# Patient Record
Sex: Male | Born: 1960 | Race: White | Hispanic: No | Smoking: Current every day smoker
Health system: Southern US, Community
[De-identification: ages and names within clinical notes are randomized; demographics above are authoritative.]

## PROBLEM LIST (undated history)

## (undated) DIAGNOSIS — K5792 Diverticulitis of intestine, part unspecified, without perforation or abscess without bleeding: Secondary | ICD-10-CM

## (undated) DIAGNOSIS — F1910 Other psychoactive substance abuse, uncomplicated: Secondary | ICD-10-CM

## (undated) HISTORY — PX: HERNIA REPAIR: SHX51

## (undated) HISTORY — PX: DENTAL SURGERY: SHX609

---

## 2004-04-03 ENCOUNTER — Emergency Department (HOSPITAL_COMMUNITY): Admission: EM | Admit: 2004-04-03 | Discharge: 2004-04-03 | Payer: Self-pay | Admitting: Emergency Medicine

## 2006-01-06 ENCOUNTER — Encounter: Payer: Self-pay | Admitting: Emergency Medicine

## 2006-01-07 ENCOUNTER — Inpatient Hospital Stay (HOSPITAL_COMMUNITY): Admission: EM | Admit: 2006-01-07 | Discharge: 2006-01-09 | Payer: Self-pay | Admitting: Emergency Medicine

## 2006-01-25 ENCOUNTER — Ambulatory Visit (HOSPITAL_COMMUNITY): Admission: RE | Admit: 2006-01-25 | Discharge: 2006-01-25 | Payer: Self-pay | Admitting: Family Medicine

## 2006-02-28 ENCOUNTER — Emergency Department (HOSPITAL_COMMUNITY): Admission: EM | Admit: 2006-02-28 | Discharge: 2006-02-28 | Payer: Self-pay | Admitting: Emergency Medicine

## 2006-11-08 ENCOUNTER — Ambulatory Visit (HOSPITAL_COMMUNITY): Admission: RE | Admit: 2006-11-08 | Discharge: 2006-11-08 | Payer: Self-pay | Admitting: Family Medicine

## 2007-04-25 ENCOUNTER — Ambulatory Visit (HOSPITAL_COMMUNITY): Admission: RE | Admit: 2007-04-25 | Discharge: 2007-04-25 | Payer: Self-pay | Admitting: Family Medicine

## 2007-05-12 ENCOUNTER — Emergency Department (HOSPITAL_COMMUNITY): Admission: EM | Admit: 2007-05-12 | Discharge: 2007-05-13 | Payer: Self-pay | Admitting: Emergency Medicine

## 2007-09-06 ENCOUNTER — Ambulatory Visit (HOSPITAL_COMMUNITY): Admission: RE | Admit: 2007-09-06 | Discharge: 2007-09-06 | Payer: Self-pay | Admitting: Family Medicine

## 2007-09-28 ENCOUNTER — Emergency Department (HOSPITAL_COMMUNITY): Admission: EM | Admit: 2007-09-28 | Discharge: 2007-09-28 | Payer: Self-pay | Admitting: Emergency Medicine
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
## 2009-10-09 ENCOUNTER — Ambulatory Visit (HOSPITAL_COMMUNITY): Admission: RE | Admit: 2009-10-09 | Discharge: 2009-10-09 | Payer: Self-pay | Admitting: General Surgery

## 2010-07-20 ENCOUNTER — Emergency Department (HOSPITAL_COMMUNITY)
Admission: EM | Admit: 2010-07-20 | Discharge: 2010-07-20 | Payer: Self-pay | Source: Home / Self Care | Admitting: Emergency Medicine

## 2010-07-26 LAB — CBC
HCT: 39.6 % (ref 39.0–52.0)
Hemoglobin: 14.2 g/dL (ref 13.0–17.0)
MCH: 31.6 pg (ref 26.0–34.0)
MCHC: 35.9 g/dL (ref 30.0–36.0)
MCV: 88.2 fL (ref 78.0–100.0)
Platelets: 201 10*3/uL (ref 150–400)
RBC: 4.49 MIL/uL (ref 4.22–5.81)
RDW: 12.8 % (ref 11.5–15.5)
WBC: 5.6 10*3/uL (ref 4.0–10.5)

## 2010-07-26 LAB — BASIC METABOLIC PANEL
BUN: 3 mg/dL — ABNORMAL LOW (ref 6–23)
CO2: 30 mEq/L (ref 19–32)
Calcium: 9.7 mg/dL (ref 8.4–10.5)
Chloride: 101 mEq/L (ref 96–112)
Creatinine, Ser: 1 mg/dL (ref 0.4–1.5)
GFR calc Af Amer: >60 mL/min (ref >60)
GFR calc non Af Amer: >60 mL/min (ref >60)
Glucose, Bld: 95 mg/dL (ref 70–99)
Potassium: 4 mEq/L (ref 3.5–5.1)
Sodium: 139 mEq/L (ref 135–145)

## 2010-07-26 LAB — RAPID URINE DRUG SCREEN, HOSP PERFORMED
Amphetamines: NOT DETECTED
Barbiturates: NOT DETECTED
Benzodiazepines: POSITIVE — AB
Cocaine: NOT DETECTED
Opiates: NOT DETECTED
Tetrahydrocannabinol: NOT DETECTED

## 2010-07-26 LAB — DIFFERENTIAL
Basophils Absolute: 0 10*3/uL (ref 0.0–0.1)
Basophils Relative: 0 % (ref 0–1)
Eosinophils Absolute: 0.1 10*3/uL (ref 0.0–0.7)
Eosinophils Relative: 1 % (ref 0–5)
Lymphocytes Relative: 32 % (ref 12–46)
Lymphs Abs: 1.8 10*3/uL (ref 0.7–4.0)
Monocytes Absolute: 1 10*3/uL (ref 0.1–1.0)
Monocytes Relative: 18 % — ABNORMAL HIGH (ref 3–12)
Neutro Abs: 2.7 10*3/uL (ref 1.7–7.7)
Neutrophils Relative %: 48 % (ref 43–77)

## 2010-07-26 LAB — ETHANOL: Alcohol, Ethyl (B): <5 mg/dL (ref 0–10)

## 2010-08-02 ENCOUNTER — Encounter: Payer: Self-pay | Admitting: Internal Medicine

## 2010-10-03 LAB — DIFFERENTIAL
Basophils Absolute: 0 10*3/uL (ref 0.0–0.1)
Basophils Relative: 0 % (ref 0–1)
Eosinophils Absolute: 0.2 10*3/uL (ref 0.0–0.7)
Eosinophils Relative: 1 % (ref 0–5)
Lymphocytes Relative: 16 % (ref 12–46)
Lymphs Abs: 2.2 10*3/uL (ref 0.7–4.0)
Monocytes Absolute: 0.9 10*3/uL (ref 0.1–1.0)
Monocytes Relative: 6 % (ref 3–12)
Neutro Abs: 10.7 10*3/uL — ABNORMAL HIGH (ref 1.7–7.7)
Neutrophils Relative %: 77 % (ref 43–77)

## 2010-10-03 LAB — CBC
HCT: 43 % (ref 39.0–52.0)
Hemoglobin: 14.9 g/dL (ref 13.0–17.0)
MCHC: 34.6 g/dL (ref 30.0–36.0)
MCV: 92.6 fL (ref 78.0–100.0)
Platelets: 171 10*3/uL (ref 150–400)
RBC: 4.65 MIL/uL (ref 4.22–5.81)
RDW: 14.3 % (ref 11.5–15.5)
WBC: 14 10*3/uL — ABNORMAL HIGH (ref 4.0–10.5)

## 2010-10-03 LAB — BASIC METABOLIC PANEL
BUN: 8 mg/dL (ref 6–23)
CO2: 30 mEq/L (ref 19–32)
Calcium: 9.2 mg/dL (ref 8.4–10.5)
Chloride: 103 mEq/L (ref 96–112)
Creatinine, Ser: 1.03 mg/dL (ref 0.4–1.5)
GFR calc Af Amer: >60 mL/min (ref >60)
GFR calc non Af Amer: >60 mL/min (ref >60)
Glucose, Bld: 69 mg/dL — ABNORMAL LOW (ref 70–99)
Potassium: 3.8 mEq/L (ref 3.5–5.1)
Sodium: 140 mEq/L (ref 135–145)
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
## 2011-01-22 ENCOUNTER — Emergency Department (HOSPITAL_COMMUNITY)
Admission: EM | Admit: 2011-01-22 | Discharge: 2011-01-22 | Disposition: A | Discharge status: Home / Self Care | Payer: Self-pay | Attending: Emergency Medicine | Admitting: Emergency Medicine

## 2011-01-22 ENCOUNTER — Encounter: Payer: Self-pay | Admitting: Emergency Medicine

## 2011-01-22 ENCOUNTER — Other Ambulatory Visit: Payer: Self-pay

## 2011-01-22 DIAGNOSIS — R45851 Suicidal ideations: Secondary | ICD-10-CM | POA: Insufficient documentation

## 2011-01-22 DIAGNOSIS — F1410 Cocaine abuse, uncomplicated: Secondary | ICD-10-CM

## 2011-01-22 DIAGNOSIS — F1010 Alcohol abuse, uncomplicated: Secondary | ICD-10-CM | POA: Insufficient documentation

## 2011-01-22 DIAGNOSIS — F17200 Nicotine dependence, unspecified, uncomplicated: Secondary | ICD-10-CM | POA: Insufficient documentation

## 2011-01-22 DIAGNOSIS — F1420 Cocaine dependence, uncomplicated: Secondary | ICD-10-CM | POA: Insufficient documentation

## 2011-01-22 LAB — DIFFERENTIAL
Basophils Relative: 1 % (ref 0–1)
Eosinophils Absolute: 0.1 10*3/uL (ref 0.0–0.7)
Neutrophils Relative %: 51 % (ref 43–77)

## 2011-01-22 LAB — CBC
MCH: 30.8 pg (ref 26.0–34.0)
MCHC: 34.7 g/dL (ref 30.0–36.0)
Platelets: 189 10*3/uL (ref 150–400)
RBC: 4.94 MIL/uL (ref 4.22–5.81)

## 2011-01-22 LAB — BASIC METABOLIC PANEL
GFR calc Af Amer: >60 mL/min (ref >60)
GFR calc non Af Amer: >60 mL/min (ref >60)
Potassium: 3.2 mEq/L — ABNORMAL LOW (ref 3.5–5.1)
Sodium: 140 mEq/L (ref 135–145)

## 2011-01-22 LAB — RAPID URINE DRUG SCREEN, HOSP PERFORMED
Amphetamines: NOT DETECTED
Barbiturates: NOT DETECTED
Cocaine: POSITIVE — AB
Opiates: NOT DETECTED
Tetrahydrocannabinol: NOT DETECTED

## 2011-01-22 LAB — ETHANOL
Alcohol, Ethyl (B): 206 mg/dL — ABNORMAL HIGH (ref 0–11)
Alcohol, Ethyl (B): 74 mg/dL — ABNORMAL HIGH (ref 0–11)

## 2011-01-22 LAB — URINALYSIS, ROUTINE W REFLEX MICROSCOPIC
Bilirubin Urine: NEGATIVE
Glucose, UA: NEGATIVE mg/dL
Hgb urine dipstick: NEGATIVE
Protein, ur: NEGATIVE mg/dL

## 2011-01-22 MED ORDER — FOLIC ACID 1 MG PO TABS
1.0000 mg | ORAL_TABLET | Freq: Once | ORAL | Status: AC
  Administered 2011-01-22: 1 mg via ORAL
  Filled 2011-01-22: qty 1

## 2011-01-22 MED ORDER — POTASSIUM CHLORIDE CRYS ER 20 MEQ PO TBCR
60.0000 meq | EXTENDED_RELEASE_TABLET | Freq: Once | ORAL | Status: AC
  Administered 2011-01-22: 60 meq via ORAL
  Filled 2011-01-22: qty 3

## 2011-01-22 MED ORDER — NICOTINE 21 MG/24HR TD PT24
21.0000 mg | MEDICATED_PATCH | Freq: Once | TRANSDERMAL | Status: DC
  Administered 2011-01-22: 21 mg via TRANSDERMAL
  Filled 2011-01-22: qty 1

## 2011-01-22 MED ORDER — VITAMIN B-1 100 MG PO TABS
100.0000 mg | ORAL_TABLET | Freq: Once | ORAL | Status: AC
  Administered 2011-01-22: 100 mg via ORAL
  Filled 2011-01-22: qty 1

## 2011-01-22 NOTE — ED Notes (Signed)
Patient with no complaints at this time. Respirations even and unlabored. Skin warm/dry. Discharge instructions reviewed with patient at this time. Patient given opportunity to voice concerns/ask questions. Patient discharged at this time and left Emergency Department with steady gait.   

## 2011-01-22 NOTE — ED Provider Notes (Addendum)
History     Chief Complaint  Patient presents with  . Addiction Problem  . 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 Has spent >$7000 on cocaine and other drugs. He also drinks in an attempt to "take the edge off". He worked as a Psychologist, occupational for years and is currently laid off. He is using the money he has made in the past.He has used heroin, abuses narcotics and benzodiazepines. He has a sister that committed suicide due to alcohol. Since he has escalated his use of heroin, he has thought on occasion that if he could not get help he might have to end his life as she did. He denies current suicidal ideation. He is not psychotic.  Patient is a 50 y.o. male presenting with drug/alcohol assessment. The history is provided by the patient.  Drug / Alcohol Assessment Primary symptoms include agitation and intoxication. This is a chronic problem. The current episode started more than 1 week ago. The problem has not changed since onset.Suspected agents include alcohol, cocaine, heroin, opiates and prescription drugs.    History reviewed. No pertinent past medical history.  Past Surgical History  Procedure Date  . Hernia repair     History reviewed. No pertinent family history.  History  Substance Use Topics  . Smoking status: Current Everyday Smoker -- 4.0 packs/day    Types: Cigarettes  . Smokeless tobacco: Not on file  . Alcohol Use: Yes      Review of Systems  Psychiatric/Behavioral: Positive for agitation.  All other systems reviewed and are negative.    Physical Exam  BP 148/92  Pulse 85  Temp(Src) 97.8 F (36.6 C) (Oral)  Resp 18  Ht 6' (1.829 m)  Wt 179 lb (81.194 kg)  BMI 24.28 kg/m2  SpO2 98%  Physical Exam  Constitutional: He is oriented to person, place, and time. He appears well-developed and well-nourished.       dishevled  HENT:  Head: Normocephalic.  Right Ear: External ear normal.    Left Ear: External ear normal.  Nose: Nose normal.  Mouth/Throat: Oropharynx is clear and moist.  Eyes: Conjunctivae and EOM are normal. Pupils are equal, round, and reactive to light.  Neck: Normal range of motion. Neck supple. No JVD present. No thyromegaly present.  Cardiovascular: Normal rate, regular rhythm, normal heart sounds and intact distal pulses.   Pulmonary/Chest: Effort normal and breath sounds normal.  Abdominal: Soft. Bowel sounds are normal.  Musculoskeletal: Normal range of motion.  Neurological: He is alert and oriented to person, place, and time. He has normal reflexes.  Skin: Skin is warm and dry.  Psychiatric: He has a normal mood and affect. His behavior is normal. Thought content normal.    ED Course  Procedures  MDM  Date: 01/22/2011  Rate: 80 Rhythm: normal sinus rhythm  QRS Axis: normal  Intervals: minimal criteria for LVH  ST/T Wave abnormalities: normal  Conduction Disutrbances:none  Narrative Interpretation:   Old EKG Reviewed: none available        Dquan Cortopassi S. Colon Branch, MD 01/22/11 [id number]  Nicoletta Dress. Colon Branch, MD 01/22/11 [id number]  Nicoletta Dress. Colon Branch, MD 01/22/11 [phone]

## 2011-01-22 NOTE — ED Provider Notes (Signed)
History     Chief Complaint  Patient presents with  . Addiction Problem  . Suicidal   HPI  History reviewed. No pertinent past medical history.  Past Surgical History  Procedure Date  . Hernia repair     History reviewed. No pertinent family history.  History  Substance Use Topics  . Smoking status: Current Everyday Smoker -- 4.0 packs/day    Types: Cigarettes  . Smokeless tobacco: Not on file  . Alcohol Use: Yes      Review of Systems  Physical Exam  BP 131/85  Pulse 81  Temp(Src) 97.8 F (36.6 C) (Oral)  Resp 20  Ht 6' (1.829 m)  Wt 179 lb (81.194 kg)  BMI 24.28 kg/m2  SpO2 100%  Physical Exam  ED Course  Procedures  MDM  Samson Frederic, ACT states patient can be discharged to go to [facility] as an outpatient. He doesn't meet criteria for alcohol detox and the cocaine detox can be done as an outpatient. PT sitting on stretcher and is agreeable to being discharged.     Ward Givens, MD 01/22/11 [phone]

## 2011-01-22 NOTE — ED Notes (Signed)
Patient reports history of extensive drug use over past two months. Patient reports feeling hopeless and depressed and actively wants help to detox from drugs and alcohol. Patient denies SI/HI at this time. Denies hallucinations and no delusions noted. Will continue to monitor patient closely.  Patient provided breakfast tray.

## 2011-01-22 NOTE — ED Notes (Signed)
Ella with ACT team in with patient at this time.

## 2011-01-22 NOTE — ED Notes (Signed)
Patient states has been using heroin x 4 years and has done cocaine for "many" years; last used heroin 3-4 days ago and cocaine 4 hours ago.  Patient states that he has been drinking beer tonight.  Patient states he has also used narcotic pain pills and Xanax abuse.  Patient states tonight he felt his heart racing and had thoughts of suicide.

## 2011-01-22 NOTE — Consult Note (Signed)
Spoke with Hattie Perch, Behavioral Health ACT who will see the patient later in the day. His ETOH level is too high to be considered by facilities at the present. Advised her that is medically cleared for admission to a psych facility/treatment.

## 2011-01-22 NOTE — ED Notes (Signed)
Pt came in wanting help to get off alcohol, cocaine and heroin; pt states he last used cocaine x4 hrs ago; pt states he has been using these drugs for many years and spent $7,000 in the last month on drugs; pt states he hears voices in his head that tell him he has to kill himself in order to get off the drugs; pt's sister and mother are with him at this time

## 2011-04-05 ENCOUNTER — Emergency Department (HOSPITAL_COMMUNITY): Payer: Self-pay

## 2011-04-05 ENCOUNTER — Emergency Department (HOSPITAL_COMMUNITY)
Admission: EM | Admit: 2011-04-05 | Discharge: 2011-04-05 | Disposition: A | Discharge status: Home / Self Care | Payer: Self-pay | Attending: Emergency Medicine | Admitting: Emergency Medicine

## 2011-04-05 ENCOUNTER — Encounter (HOSPITAL_COMMUNITY): Payer: Self-pay

## 2011-04-05 ENCOUNTER — Other Ambulatory Visit: Payer: Self-pay

## 2011-04-05 DIAGNOSIS — T6391XA Toxic effect of contact with unspecified venomous animal, accidental (unintentional), initial encounter: Secondary | ICD-10-CM | POA: Insufficient documentation

## 2011-04-05 DIAGNOSIS — T63461A Toxic effect of venom of wasps, accidental (unintentional), initial encounter: Secondary | ICD-10-CM | POA: Insufficient documentation

## 2011-04-05 DIAGNOSIS — F1910 Other psychoactive substance abuse, uncomplicated: Secondary | ICD-10-CM | POA: Insufficient documentation

## 2011-04-05 DIAGNOSIS — R0602 Shortness of breath: Secondary | ICD-10-CM | POA: Insufficient documentation

## 2011-04-05 DIAGNOSIS — R1310 Dysphagia, unspecified: Secondary | ICD-10-CM | POA: Insufficient documentation

## 2011-04-05 DIAGNOSIS — R109 Unspecified abdominal pain: Secondary | ICD-10-CM

## 2011-04-05 DIAGNOSIS — R079 Chest pain, unspecified: Secondary | ICD-10-CM | POA: Insufficient documentation

## 2011-04-05 DIAGNOSIS — F17200 Nicotine dependence, unspecified, uncomplicated: Secondary | ICD-10-CM | POA: Insufficient documentation

## 2011-04-05 DIAGNOSIS — R0789 Other chest pain: Secondary | ICD-10-CM | POA: Insufficient documentation

## 2011-04-05 LAB — DIFFERENTIAL
Basophils Absolute: 0 10*3/uL (ref 0.0–0.1)
Eosinophils Relative: 1 % (ref 0–5)
Lymphocytes Relative: 38 % (ref 12–46)
Neutro Abs: 6.8 10*3/uL (ref 1.7–7.7)

## 2011-04-05 LAB — COMPREHENSIVE METABOLIC PANEL
ALT: 19 U/L (ref 0–53)
AST: 26 U/L (ref 0–37)
CO2: 25 mEq/L (ref 19–32)
Calcium: 9.4 mg/dL (ref 8.4–10.5)
GFR calc non Af Amer: >60 mL/min (ref >60)
Sodium: 135 mEq/L (ref 135–145)

## 2011-04-05 LAB — CBC
MCV: 90 fL (ref 78.0–100.0)
Platelets: 188 10*3/uL (ref 150–400)
RDW: 13.5 % (ref 11.5–15.5)
WBC: 13.2 10*3/uL — ABNORMAL HIGH (ref 4.0–10.5)

## 2011-04-05 LAB — CARDIAC PANEL(CRET KIN+CKTOT+MB+TROPI)
CK, MB: 3.6 ng/mL (ref 0.3–4.0)
Troponin I: <0.3 ng/mL (ref <0.30)

## 2011-04-05 MED ORDER — RANITIDINE HCL 150 MG PO CAPS: 150.0000 mg | ORAL_CAPSULE | Freq: Two times a day (BID) | ORAL | Status: DC

## 2011-04-05 MED ORDER — GI COCKTAIL ~~LOC~~
30.0000 mL | Freq: Once | ORAL | Status: AC
  Administered 2011-04-05: 30 mL via ORAL
  Filled 2011-04-05: qty 30

## 2011-04-05 MED ORDER — METHYLPREDNISOLONE SODIUM SUCC 125 MG IJ SOLR
125.0000 mg | Freq: Once | INTRAMUSCULAR | Status: AC
  Administered 2011-04-05: 125 mg via INTRAVENOUS
  Filled 2011-04-05: qty 2

## 2011-04-05 MED ORDER — FAMOTIDINE IN NACL 20-0.9 MG/50ML-% IV SOLN
20.0000 mg | Freq: Once | INTRAVENOUS | Status: AC
  Administered 2011-04-05: 20 mg via INTRAVENOUS
  Filled 2011-04-05: qty 50

## 2011-04-05 MED ORDER — ONDANSETRON HCL 4 MG/2ML IJ SOLN
4.0000 mg | Freq: Once | INTRAMUSCULAR | Status: AC
  Administered 2011-04-05: 4 mg via INTRAVENOUS
  Filled 2011-04-05: qty 2

## 2011-04-05 MED ORDER — SODIUM CHLORIDE 0.9 % IV SOLN
Freq: Once | INTRAVENOUS | Status: AC
  Administered 2011-04-05: 17:00:00 via INTRAVENOUS

## 2011-04-05 MED ORDER — DIPHENHYDRAMINE HCL 50 MG/ML IJ SOLN
50.0000 mg | Freq: Once | INTRAMUSCULAR | Status: AC
  Administered 2011-04-05: 50 mg via INTRAVENOUS
  Filled 2011-04-05: qty 1

## 2011-04-05 MED ORDER — HYDROMORPHONE HCL 1 MG/ML IJ SOLN
1.0000 mg | Freq: Once | INTRAMUSCULAR | Status: AC
  Administered 2011-04-05: 1 mg via INTRAVENOUS
  Filled 2011-04-05: qty 1

## 2011-04-05 NOTE — ED Notes (Signed)
Pt states that his swallowing is better and is abdominal pain is gone. Pt still c/o tightness in his chest. IV infusing with no edema or redness. No difficulty breathing noted.

## 2011-04-05 NOTE — ED Notes (Signed)
Pt states he is feeling better at this time & ready to go.
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
## 2011-04-05 NOTE — ED Provider Notes (Addendum)
History    Scribed for Benny Lennert, MD, the patient was seen in room APA05/APA05. This chart was scribed by Katha Cabal. This patient's care was started at 16: 44.     CSN: [id number] Arrival date & time: 04/05/2011  4:34 PM  Chief Complaint  Patient presents with  . Insect Bite    HPI  (Consider location/radiation/quality/duration/timing/severity/associated sxs/prior treatment)  HPI  James Blake is a 50 y.o. male who presents to the Emergency Department complaining of bite by [place] Hornet followed by sudden onset of constant chest pain, vomiting (x1), and SOB prior to arrival in ED.  Pain and SOB continue in ED.  Patient picked up his beer and after swallowing has severe central chest pain.  Patient states he then stuck his finger down his throat to induce vomiting  then saw a [place] Hornet on his lap.  Denies current abdominal pain and hematemesis.    PCP Cassell Smiles., MD     PAST MEDICAL HISTORY:  History reviewed. No pertinent past medical history.  PAST SURGICAL HISTORY:  Past Surgical History  Procedure Date  . Hernia repair     FAMILY HISTORY:  No family history on file.   SOCIAL HISTORY: History   Social History  . Marital Status: Single    Spouse Name: N/A    Number of Children: N/A  . Years of Education: N/A   Social History Main Topics  . Smoking status: Current Everyday Smoker -- 4.0 packs/day    Types: Cigarettes  . Smokeless tobacco: None  . Alcohol Use: Yes  . Drug Use: Yes    Special: Cocaine     heroin, Xanax and narcotic pain  medicine  . Sexually Active:    Other Topics Concern  . None   Social History Narrative  . None      Review of Systems  Review of Systems  Constitutional: Negative for fatigue.  HENT: Positive for trouble swallowing. Negative for congestion, sinus pressure and ear discharge.   Eyes: Negative for discharge.  Respiratory: Positive for chest tightness and shortness of breath. Negative for  cough.   Cardiovascular: Positive for chest pain.  Gastrointestinal: Negative for abdominal pain and diarrhea.  Genitourinary: Negative for frequency and hematuria.  Musculoskeletal: Negative for back pain.  Skin: Negative for rash.  Neurological: Negative for seizures and headaches.  Hematological: Negative.   Psychiatric/Behavioral: Negative for hallucinations.    Allergies  Review of patient's allergies indicates no known allergies.  Home Medications  No current outpatient prescriptions on file.  Physical Exam    BP 125/106  Pulse 121  Resp 20  Ht 5\' 11"  (1.803 m)  Wt 185 lb (83.915 kg)  BMI 25.80 kg/m2  SpO2 94%  Physical Exam  Constitutional: He is oriented to person, place, and time. He appears well-developed.  HENT:  Head: Normocephalic and atraumatic.  Mouth/Throat: Oropharynx is clear and moist.  Eyes: Conjunctivae and EOM are normal. Pupils are equal, round, and reactive to light. No scleral icterus.  Neck: Neck supple. No thyromegaly present.  Cardiovascular: Normal rate, regular rhythm and normal heart sounds.  Exam reveals no gallop and no friction rub.   No murmur heard. Pulmonary/Chest: Effort normal and breath sounds normal. No stridor. No respiratory distress. He has no wheezes. He has no rales. He exhibits no tenderness.  Abdominal: Soft. He exhibits no distension. There is no tenderness. There is no rebound and no guarding.  Musculoskeletal: Normal range of motion. He exhibits no edema.  Lymphadenopathy:    He has no cervical adenopathy.  Neurological: He is alert and oriented to person, place, and time. Coordination normal.  Skin: No rash noted. No erythema.  Psychiatric: He has a normal mood and affect. His behavior is normal.    ED Course  Procedures (including critical care time) OTHER DATA REVIEWED: Nursing notes, vital signs, and past medical records reviewed.   DIAGNOSTIC STUDIES: Oxygen Saturation is 94% on room air, normal by my  interpretation.       LABS / RADIOLOGY:  Results for orders placed during the hospital encounter of 04/05/11  CBC      Component Value Range   WBC 13.2 (*) 4.0 - 10.5 (K/uL)   RBC 4.69  4.22 - 5.81 (MIL/uL)   Hemoglobin 14.5  13.0 - 17.0 (g/dL)   HCT 16.1  09.6 - 04.5 (%)   MCV 90.0  78.0 - 100.0 (fL)   MCH 30.9  26.0 - 34.0 (pg)   MCHC 34.4  30.0 - 36.0 (g/dL)   RDW 40.9  81.1 - 91.4 (%)   Platelets 188  150 - 400 (K/uL)  DIFFERENTIAL      Component Value Range   Neutrophils Relative 52  43 - 77 (%)   Neutro Abs 6.8  1.7 - 7.7 (K/uL)   Lymphocytes Relative 38  12 - 46 (%)   Lymphs Abs 5.0 (*) 0.7 - 4.0 (K/uL)   Monocytes Relative 9  3 - 12 (%)   Monocytes Absolute 1.2 (*) 0.1 - 1.0 (K/uL)   Eosinophils Relative 1  0 - 5 (%)   Eosinophils Absolute 0.2  0.0 - 0.7 (K/uL)   Basophils Relative 0  0 - 1 (%)   Basophils Absolute 0.0  0.0 - 0.1 (K/uL)  COMPREHENSIVE METABOLIC PANEL      Component Value Range   Sodium 135  135 - 145 (mEq/L)   Potassium 3.6  3.5 - 5.1 (mEq/L)   Chloride 97  96 - 112 (mEq/L)   CO2 25  19 - 32 (mEq/L)   Glucose, Bld 104 (*) 70 - 99 (mg/dL)   BUN 10  6 - 23 (mg/dL)   Creatinine, Ser 7.82  0.50 - 1.35 (mg/dL)   Calcium 9.4  8.4 - 95.6 (mg/dL)   Total Protein 7.3  6.0 - 8.3 (g/dL)   Albumin 4.3  3.5 - 5.2 (g/dL)   AST 26  0 - 37 (U/L)   ALT 19  0 - 53 (U/L)   Alkaline Phosphatase 66  39 - 117 (U/L)   Total Bilirubin 0.3  0.3 - 1.2 (mg/dL)   GFR calc non Af Amer >60  >60 (mL/min)   GFR calc Af Amer >60  >60 (mL/min)  LIPASE, BLOOD      Component Value Range   Lipase 30  11 - 59 (U/L)  CARDIAC PANEL(CRET KIN+CKTOT+MB+TROPI)      Component Value Range   Total CK 491 (*) 7 - 232 (U/L)   CK, MB 3.6  0.3 - 4.0 (ng/mL)   Troponin I <0.30  <0.30 (ng/mL)   Relative Index 0.7  0.0 - 2.5      Dg Chest Portable 1 View  04/05/2011  *RADIOLOGY REPORT*  Clinical Data: Swallowed a [place] hornet  PORTABLE CHEST - 1 VIEW  Comparison: Chest radiograph  04/25/2007  Findings: The heart, mediastinal, and hilar contours are normal. Normal pulmonary vascularity.  The lungs are normally expanded and clear.  No visible pleural effusion or pneumothorax.  Bony  thorax unremarkable.  IMPRESSION: No acute cardiopulmonary disease.  Original Report Authenticated By: Britta Mccreedy, M.D.      ED COURSE / COORDINATION OF CARE: 4:48 PM  Physical exam complete.  Will order EKG consult with gastroenterologist.   8:42 PM  Plan to discharge patient home.  Patient agrees.  Patients states that he is feeling better and is ready to go home.     Orders Placed This Encounter  Procedures  . DG Chest Portable 1 View  . CBC  . Differential  . Comprehensive metabolic panel  . Lipase, blood  . Cardiac panel(cret kin+cktot+mb+tropi)  . Consult to gastroenterology  . ED EKG       IMPRESSION: Diagnoses that have been ruled out:  Diagnoses that are still under consideration:  Final diagnoses:     MEDICATIONS GIVEN IN THE E.D. Scheduled Meds:    . sodium chloride   Intravenous Once  . diphenhydrAMINE  50 mg Intravenous Once  . HYDROmorphone  1 mg Intravenous Once  . methylPREDNISolone sodium succinate  125 mg Intravenous Once  . ondansetron  4 mg Intravenous Once   Continuous Infusions:    . famotidine 20 mg (04/05/11 1717)      DISCHARGE MEDICATIONS: New Prescriptions   No medications on file   Pt without pain at discharge  The chart was scribed for me under my direct supervision.  I personally performed the history, physical, and medical decision making and all procedures in the evaluation of this patient..  .ededkg  Date: 04/16/2011  Rate: 73  Rhythm: sinus arrhythmia  QRS Axis: normal  Intervals: normal  ST/T Wave abnormalities: normal  Conduction Disutrbances:none  Narrative Interpretation:   Old EKG Reviewed: none available           Benny Lennert, MD 04/05/11 2046  Benny Lennert, MD 04/16/11 [phone]

## 2013-12-09 ENCOUNTER — Other Ambulatory Visit: Payer: Self-pay | Admitting: Hematology and Oncology

## 2016-10-02 ENCOUNTER — Emergency Department (HOSPITAL_COMMUNITY)
Admission: EM | Admit: 2016-10-02 | Discharge: 2016-10-02 | Disposition: A | Discharge status: Home / Self Care | Payer: Self-pay | Attending: Emergency Medicine | Admitting: Emergency Medicine

## 2016-10-02 ENCOUNTER — Encounter (HOSPITAL_COMMUNITY): Payer: Self-pay | Admitting: Emergency Medicine

## 2016-10-02 DIAGNOSIS — W458XXA Other foreign body or object entering through skin, initial encounter: Secondary | ICD-10-CM | POA: Insufficient documentation

## 2016-10-02 DIAGNOSIS — T1501XA Foreign body in cornea, right eye, initial encounter: Secondary | ICD-10-CM | POA: Insufficient documentation

## 2016-10-02 DIAGNOSIS — T1591XA Foreign body on external eye, part unspecified, right eye, initial encounter: Secondary | ICD-10-CM

## 2016-10-02 DIAGNOSIS — Y929 Unspecified place or not applicable: Secondary | ICD-10-CM | POA: Insufficient documentation

## 2016-10-02 DIAGNOSIS — Y9389 Activity, other specified: Secondary | ICD-10-CM | POA: Insufficient documentation

## 2016-10-02 DIAGNOSIS — Z23 Encounter for immunization: Secondary | ICD-10-CM | POA: Insufficient documentation

## 2016-10-02 DIAGNOSIS — Y999 Unspecified external cause status: Secondary | ICD-10-CM | POA: Insufficient documentation

## 2016-10-02 DIAGNOSIS — F17210 Nicotine dependence, cigarettes, uncomplicated: Secondary | ICD-10-CM | POA: Insufficient documentation

## 2016-10-02 MED ORDER — HYDROCODONE-ACETAMINOPHEN 5-325 MG PO TABS
1.0000 | ORAL_TABLET | ORAL | 0 refills | Status: DC | PRN
Start: 2016-10-02 — End: 2020-02-14

## 2016-10-02 MED ORDER — FLUORESCEIN SODIUM 0.6 MG OP STRP
1.0000 | ORAL_STRIP | Freq: Once | OPHTHALMIC | Status: AC
  Administered 2016-10-02: 1 via OPHTHALMIC

## 2016-10-02 MED ORDER — TETRACAINE HCL 0.5 % OP SOLN
2.0000 [drp] | Freq: Once | OPHTHALMIC | Status: DC
  Filled 2016-10-02: qty 4

## 2016-10-02 MED ORDER — TETANUS-DIPHTH-ACELL PERTUSSIS 5-2.5-18.5 LF-MCG/0.5 IM SUSP
0.5000 mL | Freq: Once | INTRAMUSCULAR | Status: AC
  Administered 2016-10-02: 0.5 mL via INTRAMUSCULAR
  Filled 2016-10-02: qty 0.5

## 2016-10-02 MED ORDER — ERYTHROMYCIN 5 MG/GM OP OINT
TOPICAL_OINTMENT | Freq: Once | OPHTHALMIC | Status: AC
  Administered 2016-10-02: 16:00:00 via OPHTHALMIC
  Filled 2016-10-02: qty 3.5

## 2016-10-02 MED ORDER — TETRACAINE HCL 0.5 % OP SOLN
1.0000 [drp] | Freq: Once | OPHTHALMIC | Status: AC
  Administered 2016-10-02: 1 [drp] via OPHTHALMIC

## 2016-10-02 MED ORDER — FLUORESCEIN SODIUM 0.6 MG OP STRP
1.0000 | ORAL_STRIP | Freq: Once | OPHTHALMIC | Status: DC
  Filled 2016-10-02: qty 1

## 2016-10-02 NOTE — Discharge Instructions (Signed)
Use the eye ointment 3 times a day for the next 7 days. Follow up with the eye doctor. Call tomorrow for an appointment.

## 2016-10-02 NOTE — ED Provider Notes (Signed)
Medical screening examination/treatment/procedure(s) were conducted as a shared visit with non-physician practitioner(s) and myself.  I personally evaluated the patient during the encounter.   EKG Interpretation None      55yM with FB R eye. Small piece of metal by history. Has been in there for two days. Rusted. I also attempted to remove it using slit lamp and 30g needle. I could not easily remove it and did not feel comfortable being more aggressive. Tetanus updated. Abx. Needs very close opthalmology follow-up.    Raeford RazorStephen Emmalene Kattner, MD 10/02/16 [phone]

## 2016-10-02 NOTE — ED Triage Notes (Signed)
Patient c/o foreign body in right eye. Per patient sharpening lawn mower blade 2 days ago and got small sliver of metal in eye. Patient reports trying to flush eye with no relief. Denies any changes in vision. Per patient eye "watering and painful."

## 2016-10-02 NOTE — ED Provider Notes (Signed)
[facility]-EMERGENCY DEPT Provider Note   CSN: [id number] Arrival date & time: 10/02/16  1340  By signing my name below, I, James Blake, attest that this documentation has been prepared under the direction and in the presence of James Buffalo, NP. Electronically Signed: Cynda Blake, Scribe. 10/02/16. 3:44 PM.  History   Chief Complaint Chief Complaint  Patient presents with  . Foreign Body in Eye    HPI Comments: James Blake is a 56 y.o. male with no pertinent medical history, who presents to the Emergency Department complaining of sudden-onset, constant right eye pain that began 2 days ago. Patient states he was sharpening a lawnmower blade with a grinder, when a piece of metal flew into his right eye. Patient reports associated right eye watering and right eye itching. Patient reports trying to flush the eye with no relief. Patient does wear glasses, no contacts. Patient denies any visual changes, photophobia, or eye redness.   The history is provided by the patient. No language interpreter was used.    History reviewed. No pertinent past medical history.  There are no active problems to display for this patient.   Past Surgical History:  Procedure Laterality Date  . HERNIA REPAIR         Home Medications    Prior to Admission medications   Medication Sig Start Date End Date Taking? Authorizing Provider  HYDROcodone-acetaminophen (NORCO/VICODIN) 5-325 MG tablet Take 1 tablet by mouth every 4 (four) hours as needed. 10/02/16   James Orlene Och, NP  ranitidine (ZANTAC) 150 MG capsule Take 1 capsule (150 mg total) by mouth 2 (two) times daily. 04/05/11 04/04/12  James Berkshire, MD    Family History Family History  Problem Relation Age of Onset  . Diabetes Father   . Heart failure Father     Social History Social History  Substance Use Topics  . Smoking status: Current Every Day Smoker    Packs/day: 2.00    Years: 40.00    Types: Cigarettes  . Smokeless tobacco: Never  Used  . Alcohol use 12.0 oz/week    20 Cans of beer per week     Allergies   Patient has no known allergies.   Review of Systems Review of Systems  Constitutional: Negative for fever.  HENT: Negative for ear pain.   Eyes: Positive for pain (right). Negative for photophobia, discharge, redness, itching and visual disturbance.     Physical Exam Updated Vital Signs BP 122/72   Pulse 85   Temp 98.6 F (37 C) (Oral)   Resp 18   Ht 5\' 11"  (1.803 m)   Wt 81.2 kg   SpO2 97%   BMI 24.97 kg/m   Physical Exam  Constitutional: He is oriented to person, place, and time. He appears well-developed and well-nourished.  HENT:  Head: Normocephalic.  Eyes: EOM are normal. Pupils are equal, round, and reactive to light. Foreign body present in the right eye.  Slit lamp exam:      The right eye shows foreign body. The right eye shows no corneal abrasion and no fluorescein uptake.  Neck: Neck supple.  Cardiovascular: Normal rate.   Pulmonary/Chest: Effort normal.  Musculoskeletal: Normal range of motion.  Neurological: He is alert and oriented to person, place, and time. No cranial nerve deficit.  Skin: Skin is warm and dry.  Psychiatric: He has a normal mood and affect. His behavior is normal.  Nursing note and vitals reviewed.    ED Treatments / Results  DIAGNOSTIC STUDIES:  Oxygen Saturation is 97% on RA, normal by my interpretation.    COORDINATION OF CARE: 3:44 PM Discussed treatment plan with pt at bedside and pt agreed to plan, which includes pain medication and erythromycin eye ointment.   Labs (all labs ordered are listed, but only abnormal results are displayed) Labs Reviewed - No data to display  Radiology No results found.  Procedures Procedures (including critical care time)  Medications Ordered in ED Medications  Tdap (BOOSTRIX) injection 0.5 mL (0.5 mLs Intramuscular Given 10/02/16 1444)  tetracaine (PONTOCAINE) 0.5 % ophthalmic solution 1 drop (1 drop  Right Eye Given 10/02/16 1445)  fluorescein ophthalmic strip 1 strip (1 strip Right Eye Given 10/02/16 1446)  erythromycin ophthalmic ointment ( Right Eye Given 10/02/16 1605)     Initial Impression / Assessment and Plan / ED Course  I have reviewed the triage vital signs and the nursing notes.   Discussed case with Dr. Juleen ChinaKohut. Dr. Juleen ChinaKohut evaluated the patient and was unable to remove the foreign body due to the amount of time since the injury. Dr. Juleen ChinaKohut recommended erythromycin eye ointment and an opthalmologist follow-up. Patient agrees and will call for appointment. Patient stable for d/c to f/u with Dr. Lita Blake.    Final Clinical Impressions(s) / ED Diagnoses   Final diagnoses:  Foreign body, eye, right, initial encounter    New Prescriptions Discharge Medication List as of 10/02/2016  4:02 PM     I personally performed the services described in this documentation, which was scribed in my presence. The recorded information has been reviewed and is accurate.     [address] IsabellaM [place], [place]NP 10/19/16 [phone]    James RazorStephen Kohut, MD 10/19/16 [phone]

## 2019-06-20 ENCOUNTER — Other Ambulatory Visit: Payer: Self-pay

## 2019-06-20 ENCOUNTER — Ambulatory Visit
Admission: EM | Admit: 2019-06-20 | Discharge: 2019-06-20 | Disposition: A | Discharge status: Home / Self Care | Payer: Self-pay

## 2020-02-14 ENCOUNTER — Other Ambulatory Visit: Payer: Self-pay

## 2020-02-14 ENCOUNTER — Encounter (HOSPITAL_COMMUNITY): Payer: Self-pay | Admitting: Emergency Medicine

## 2020-02-14 ENCOUNTER — Emergency Department (HOSPITAL_COMMUNITY): Payer: Self-pay

## 2020-02-14 ENCOUNTER — Emergency Department (HOSPITAL_COMMUNITY)
Admission: EM | Admit: 2020-02-14 | Discharge: 2020-02-15 | Disposition: A | Discharge status: Home / Self Care | Payer: Self-pay | Attending: Emergency Medicine | Admitting: Emergency Medicine

## 2020-02-14 DIAGNOSIS — F17210 Nicotine dependence, cigarettes, uncomplicated: Secondary | ICD-10-CM | POA: Insufficient documentation

## 2020-02-14 DIAGNOSIS — F1490 Cocaine use, unspecified, uncomplicated: Secondary | ICD-10-CM | POA: Insufficient documentation

## 2020-02-14 DIAGNOSIS — F1910 Other psychoactive substance abuse, uncomplicated: Secondary | ICD-10-CM | POA: Insufficient documentation

## 2020-02-14 HISTORY — DX: Other psychoactive substance abuse, uncomplicated: F19.10

## 2020-02-14 LAB — BASIC METABOLIC PANEL
Anion gap: 13 (ref 5–15)
BUN: 9 mg/dL (ref 6–20)
CO2: 21 mmol/L — ABNORMAL LOW (ref 22–32)
Calcium: 9.4 mg/dL (ref 8.9–10.3)
Chloride: 101 mmol/L (ref 98–111)
Creatinine, Ser: 0.93 mg/dL (ref 0.61–1.24)
GFR calc Af Amer: >60 mL/min (ref >60)
GFR calc non Af Amer: >60 mL/min (ref >60)
Glucose, Bld: 101 mg/dL — ABNORMAL HIGH (ref 70–99)
Potassium: 4 mmol/L (ref 3.5–5.1)
Sodium: 135 mmol/L (ref 135–145)

## 2020-02-14 LAB — TROPONIN I (HIGH SENSITIVITY)
Troponin I (High Sensitivity): 4 ng/L (ref <18)
Troponin I (High Sensitivity): 5 ng/L (ref <18)

## 2020-02-14 LAB — CBC
HCT: 42.3 % (ref 39.0–52.0)
Hemoglobin: 14.6 g/dL (ref 13.0–17.0)
MCH: 30.7 pg (ref 26.0–34.0)
MCHC: 34.5 g/dL (ref 30.0–36.0)
MCV: 88.9 fL (ref 80.0–100.0)
Platelets: 225 10*3/uL (ref 150–400)
RBC: 4.76 MIL/uL (ref 4.22–5.81)
RDW: 13.2 % (ref 11.5–15.5)
WBC: 12.5 10*3/uL — ABNORMAL HIGH (ref 4.0–10.5)
nRBC: 0 % (ref 0.0–0.2)

## 2020-02-14 MED ORDER — SODIUM CHLORIDE 0.9% FLUSH: 3.0000 mL | Freq: Once | INTRAVENOUS | Status: DC

## 2020-02-14 NOTE — ED Triage Notes (Signed)
Pt states he feels like he overdosed on crack, states he has used all day yesterday and all day today.  Pt reports he feels like his heart is going to beat out of his chest.

## 2020-02-14 NOTE — ED Notes (Signed)
Pt in bed, pt denies chest pain at this time, states that he has some bilateral flank pain, cardiac monitor placed, pt talking in full sentences, resps even and unlabored

## 2020-02-14 NOTE — ED Provider Notes (Signed)
[facility] EMERGENCY DEPARTMENT Provider Note   CSN: [id number] Arrival date & time: 02/14/20  1757     History Chief Complaint  Patient presents with  . Drug Problem    James Blake is a 59 y.o. male.  59 year old without significant past medical history who presents for chest pain.  Patient is a habitual cocaine user and states that he has been thinking he wanted to quit lately.  He states that today he thought maybe he would try to take so much that he got tired of it but then he started having some chest pain, shortness of breath and palpitations with sweatiness.  He is worried he is in heart attack and die so he presented here for further evaluation.  Patient was in the emerge department for 6 hours prior to my evaluation including 2 hours and some change in her room and at this time is asymptomatic.  He has no chest pain, shortness of breath, fever, cough, headache, lightheadedness or vision changes.  Patient states that he quit alcohol few years ago and replaced it with the cocaine.  He still smokes cigarettes.  Occasionally will do other drugs but mostly just cocaine.  He states that he is not suicidal this was not an attempt to kill himself he was just trying to do so much that he never wanted to do it again and he feels like he does want to quit using cocaine this time and would seek resources for that.  Patient has no suicidal homicidal ideation no hallucinations.  No psychiatric complaints.   Drug Problem       Past Medical History:  Diagnosis Date  . Substance abuse (HCC)     There are no problems to display for this patient.   Past Surgical History:  Procedure Laterality Date  . DENTAL SURGERY    . HERNIA REPAIR         Family History  Problem Relation Age of Onset  . Diabetes Father   . Heart failure Father     Social History   Tobacco Use  . Smoking status: Current Every Day Smoker    Packs/day: 2.00    Years: 40.00    Pack years: 80.00     Types: Cigarettes  . Smokeless tobacco: Never Used  Substance Use Topics  . Alcohol use: Yes    Comment: rarely drinks etoh   . Drug use: Yes    Frequency: 7.0 times per week    Types: Cocaine    Comment: smokes crack every day     Home Medications Prior to Admission medications   Medication Sig Start Date End Date Taking? Authorizing Provider  ibuprofen (ADVIL) 200 MG tablet Take 800 mg by mouth every 8 (eight) hours as needed for moderate pain.   Yes [provider]    Allergies    Patient has no known allergies.  Review of Systems   Review of Systems  All other systems reviewed and are negative.   Physical Exam Updated Vital Signs BP 115/69 (BP Location: Left Arm)   Pulse 68   Temp 98.4 F (36.9 C) (Oral)   Resp 16   Ht 5\' 11"  (1.803 m)   Wt 76.2 kg   SpO2 99%   BMI 23.43 kg/m   Physical Exam Vitals and nursing note reviewed.  Constitutional:      Appearance: He is well-developed.  HENT:     Head: Normocephalic and atraumatic.     Nose: No congestion or  rhinorrhea.     Mouth/Throat:     Mouth: Mucous membranes are moist.     Pharynx: Oropharynx is clear.  Cardiovascular:     Rate and Rhythm: Normal rate.  Pulmonary:     Effort: Pulmonary effort is normal. No respiratory distress.  Abdominal:     General: There is no distension.  Musculoskeletal:        General: No swelling or tenderness. Normal range of motion.     Cervical back: Normal range of motion.  Skin:    General: Skin is warm and dry.  Neurological:     General: No focal deficit present.     Mental Status: He is alert.     ED Results / Procedures / Treatments   Labs (all labs ordered are listed, but only abnormal results are displayed) Labs Reviewed  BASIC METABOLIC PANEL - Abnormal; Notable for the following components:      Result Value   CO2 21 (*)    Glucose, Bld 101 (*)    All other components within normal limits  CBC - Abnormal; Notable for the following components:    WBC 12.5 (*)    All other components within normal limits  TROPONIN I (HIGH SENSITIVITY)  TROPONIN I (HIGH SENSITIVITY)    EKG EKG Interpretation  Date/Time:  Friday February 14 2020 18:12:07 EDT Ventricular Rate:  98 PR Interval:  132 QRS Duration: 82 QT Interval:  352 QTC Calculation: 449 R Axis:   67 Text Interpretation: Normal sinus rhythm Normal ECG Confirmed by Vanetta Mulders [phone]) on 02/14/2020 6:28:22 PM   Radiology DG Chest 2 View  Result Date: 02/14/2020 CLINICAL DATA:  Chest pain and tightness. EXAM: CHEST - 2 VIEW COMPARISON:  04/05/2011 FINDINGS: The heart size and mediastinal contours are within normal limits. Both lungs are clear. The visualized skeletal structures are unremarkable. IMPRESSION: No active cardiopulmonary disease. Electronically Signed   By: Katherine Mantle M.D.   On: 02/14/2020 18:52    Procedures Procedures (including critical care time)  Medications Ordered in ED Medications  sodium chloride flush (NS) 0.9 % injection 3 mL (has no administration in time range)    ED Course  I have reviewed the triage vital signs and the nursing notes.  Pertinent labs & imaging results that were available during my care of the patient were reviewed by me and considered in my medical decision making (see chart for details).    MDM Rules/Calculators/A&P                          Overall patient likely had a sympathomimetic toxidrome on arrival however at this time he is gotten over it.  His troponins were negative, EKG is within normal limits he has a slight elevated white count but is likely from the drugs.  No other concerning signs or symptoms required admission to the hospital or psychiatric care.  Is not suicidal I do not think this was a suicide attempt.  I have offered peers support and resources in the community and he is grateful for that.  Stable for discharge at this time. Final Clinical Impression(s) / ED Diagnoses Final diagnoses:  Substance  abuse (HCC)    Rx / DC Orders ED Discharge Orders         Ordered    Consult to Peer Support     Discontinue    Provider:  (Not yet assigned)   02/14/20 1914  Marily Memos, MD 02/14/20 2351

## 2020-05-15 ENCOUNTER — Encounter (INDEPENDENT_AMBULATORY_CARE_PROVIDER_SITE_OTHER): Payer: Self-pay | Admitting: *Deleted

## 2021-05-28 ENCOUNTER — Other Ambulatory Visit: Payer: Self-pay | Admitting: Nurse Practitioner

## 2022-06-22 IMAGING — DX DG CHEST 2V
2 series · 2 of 2 positions shown · non-contrast
Comparison: 04/05/2011

CLINICAL DATA: Chest pain and tightness.

EXAM:
CHEST - 2 VIEW

[chest pa]
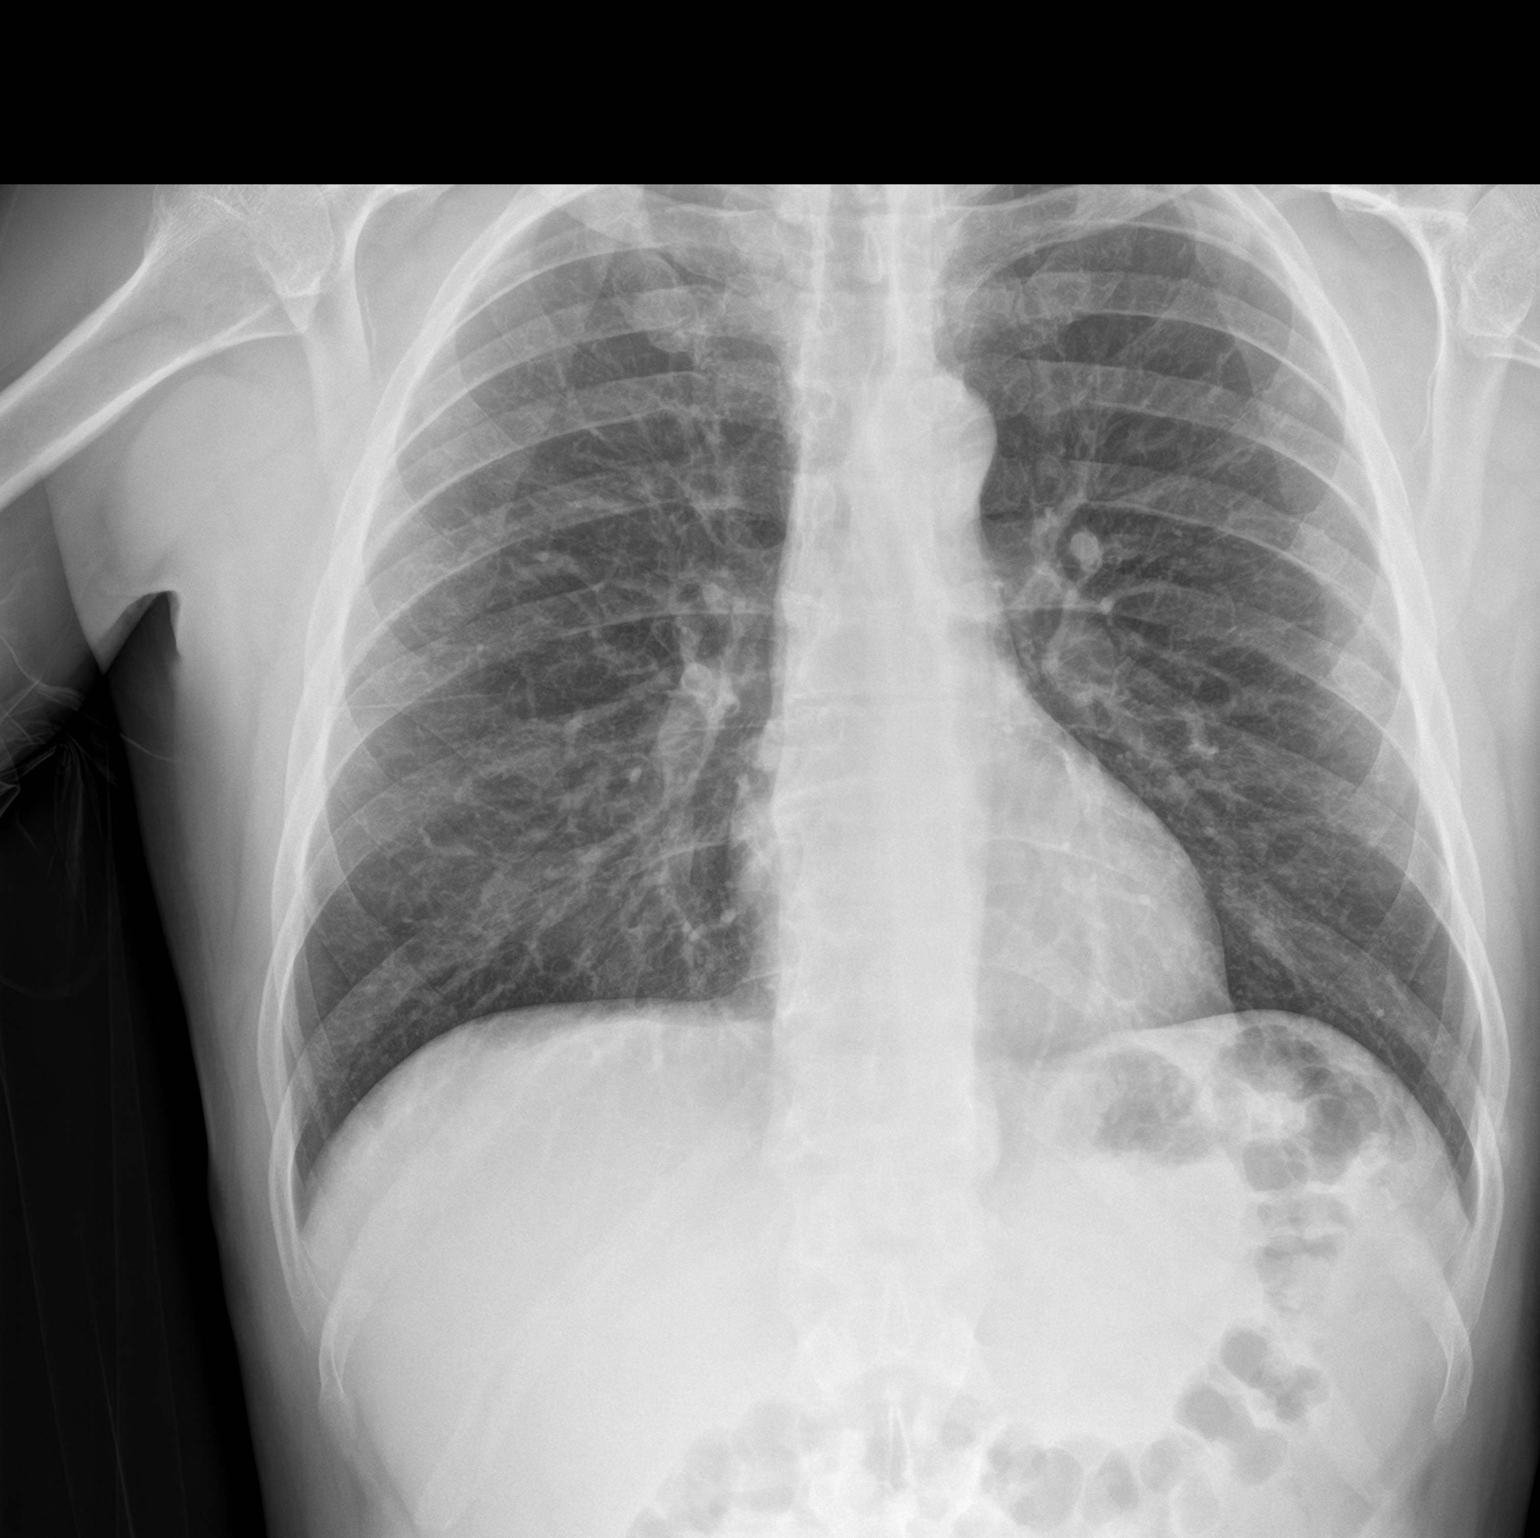

[chest lat]
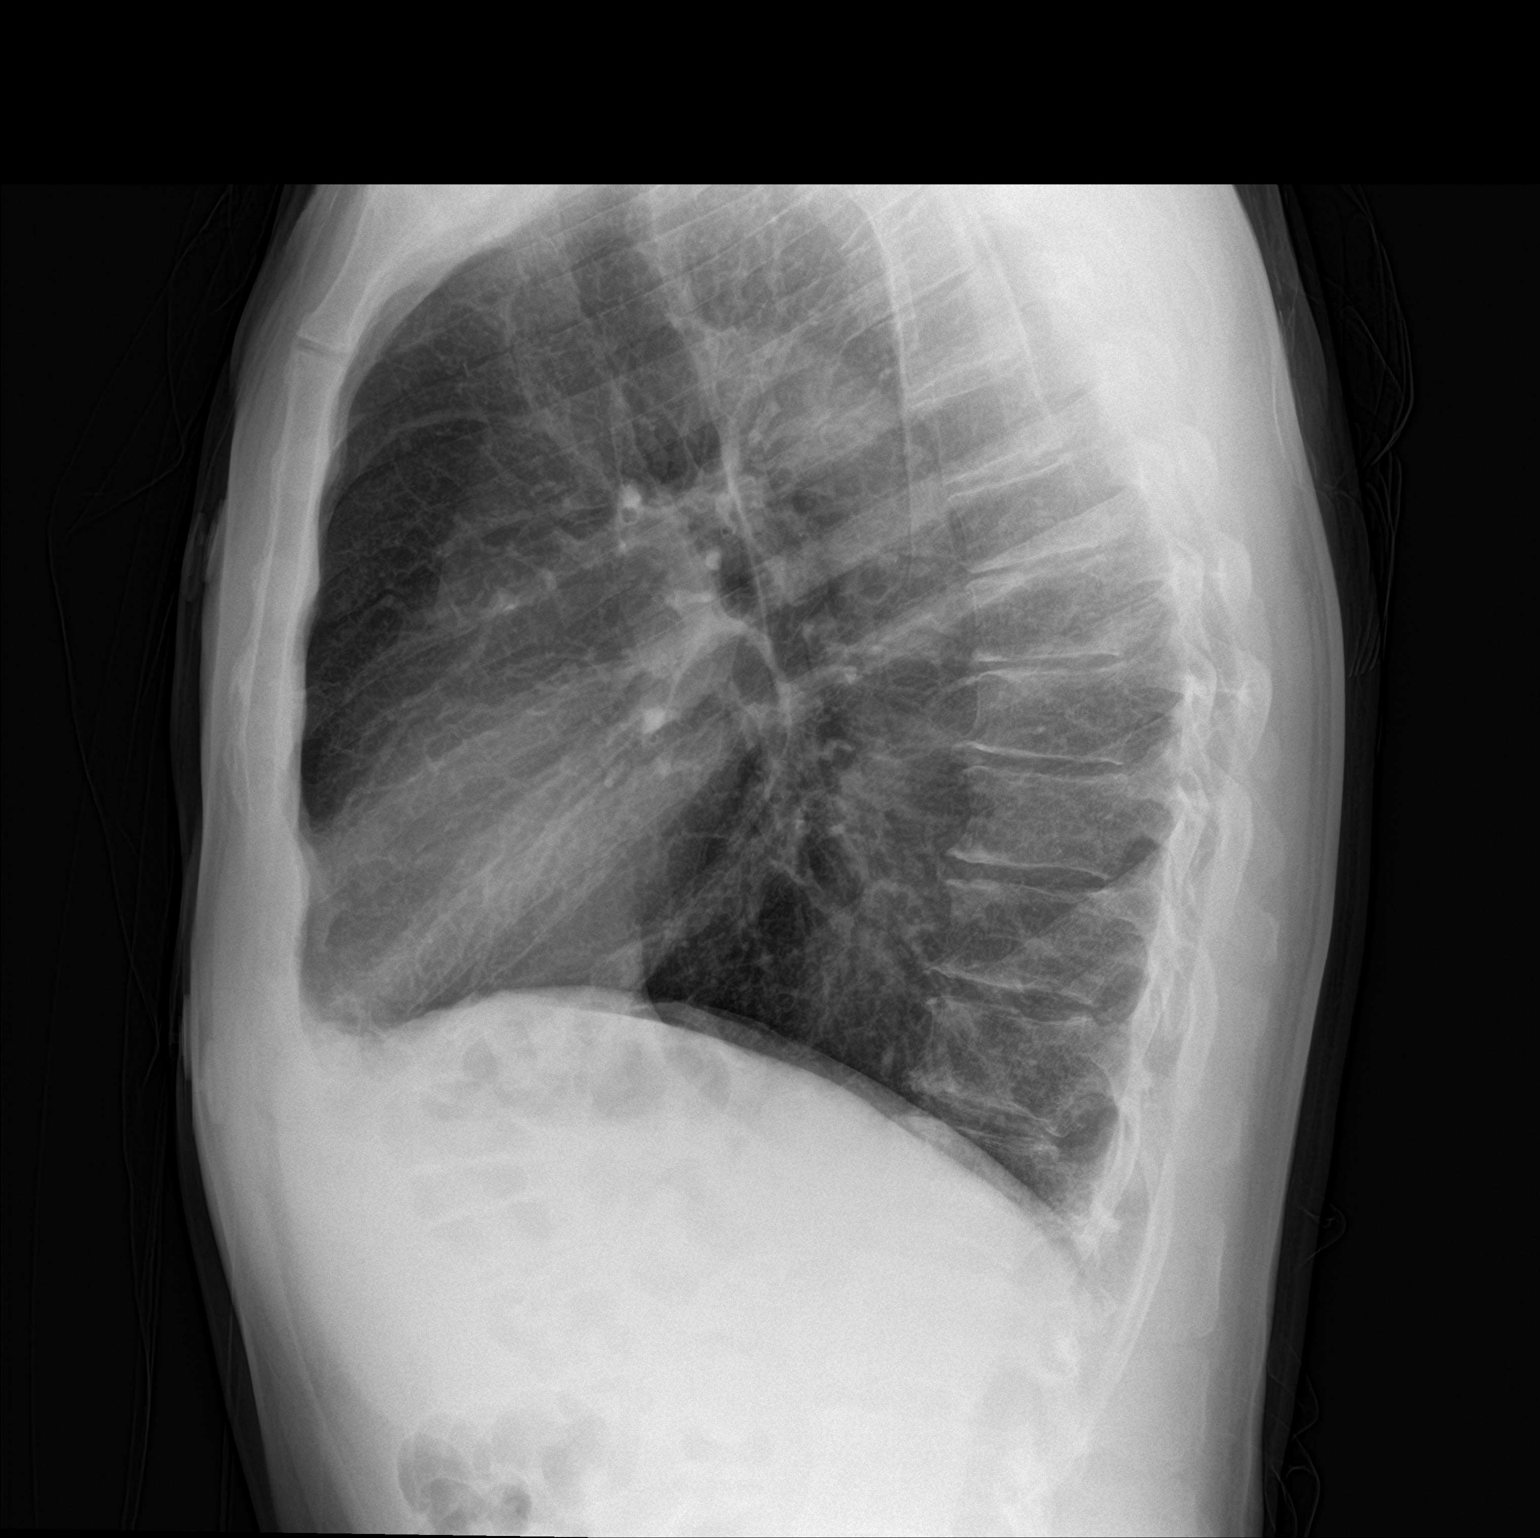

[2 of 2 positions shown; findings below may reference images not displayed]

FINDINGS: The heart size and mediastinal contours are within normal limits.
Both lungs are clear. The visualized skeletal structures are
unremarkable.
IMPRESSION: No active cardiopulmonary disease.

## 2022-09-07 ENCOUNTER — Ambulatory Visit: Payer: Self-pay

## 2022-09-07 ENCOUNTER — Inpatient Hospital Stay (HOSPITAL_COMMUNITY)
Admission: EM | Admit: 2022-09-07 | Discharge: 2022-09-10 | DRG: 391 | Disposition: A | Discharge status: Home / Self Care | Payer: 59 | Attending: Family Medicine | Admitting: Family Medicine

## 2022-09-07 ENCOUNTER — Other Ambulatory Visit: Payer: Self-pay

## 2022-09-07 ENCOUNTER — Emergency Department (HOSPITAL_COMMUNITY): Payer: 59

## 2022-09-07 ENCOUNTER — Encounter (HOSPITAL_COMMUNITY): Payer: Self-pay | Admitting: Emergency Medicine

## 2022-09-07 ENCOUNTER — Ambulatory Visit
Admission: EM | Admit: 2022-09-07 | Discharge: 2022-09-07 | Disposition: A | Discharge status: Home / Self Care | Payer: 59 | Attending: Family Medicine | Admitting: Family Medicine

## 2022-09-07 DIAGNOSIS — Z8249 Family history of ischemic heart disease and other diseases of the circulatory system: Secondary | ICD-10-CM | POA: Diagnosis not present

## 2022-09-07 DIAGNOSIS — N3091 Cystitis, unspecified with hematuria: Secondary | ICD-10-CM | POA: Diagnosis not present

## 2022-09-07 DIAGNOSIS — K651 Peritoneal abscess: Secondary | ICD-10-CM | POA: Diagnosis not present

## 2022-09-07 DIAGNOSIS — R109 Unspecified abdominal pain: Secondary | ICD-10-CM | POA: Diagnosis not present

## 2022-09-07 DIAGNOSIS — K5732 Diverticulitis of large intestine without perforation or abscess without bleeding: Secondary | ICD-10-CM | POA: Diagnosis not present

## 2022-09-07 DIAGNOSIS — F17200 Nicotine dependence, unspecified, uncomplicated: Secondary | ICD-10-CM | POA: Diagnosis not present

## 2022-09-07 DIAGNOSIS — M109 Gout, unspecified: Secondary | ICD-10-CM | POA: Diagnosis not present

## 2022-09-07 DIAGNOSIS — K5780 Diverticulitis of intestine, part unspecified, with perforation and abscess without bleeding: Secondary | ICD-10-CM | POA: Diagnosis not present

## 2022-09-07 DIAGNOSIS — K5900 Constipation, unspecified: Secondary | ICD-10-CM

## 2022-09-07 DIAGNOSIS — N3090 Cystitis, unspecified without hematuria: Secondary | ICD-10-CM | POA: Diagnosis not present

## 2022-09-07 DIAGNOSIS — F17210 Nicotine dependence, cigarettes, uncomplicated: Secondary | ICD-10-CM | POA: Diagnosis present

## 2022-09-07 DIAGNOSIS — N400 Enlarged prostate without lower urinary tract symptoms: Secondary | ICD-10-CM | POA: Diagnosis not present

## 2022-09-07 DIAGNOSIS — Z4803 Encounter for change or removal of drains: Secondary | ICD-10-CM | POA: Diagnosis not present

## 2022-09-07 DIAGNOSIS — Z28310 Unvaccinated for covid-19: Secondary | ICD-10-CM

## 2022-09-07 DIAGNOSIS — R102 Pelvic and perineal pain: Secondary | ICD-10-CM

## 2022-09-07 DIAGNOSIS — R3911 Hesitancy of micturition: Secondary | ICD-10-CM

## 2022-09-07 DIAGNOSIS — K5720 Diverticulitis of large intestine with perforation and abscess without bleeding: Secondary | ICD-10-CM | POA: Diagnosis not present

## 2022-09-07 DIAGNOSIS — I700 Atherosclerosis of aorta: Secondary | ICD-10-CM | POA: Diagnosis not present

## 2022-09-07 DIAGNOSIS — K5792 Diverticulitis of intestine, part unspecified, without perforation or abscess without bleeding: Secondary | ICD-10-CM

## 2022-09-07 DIAGNOSIS — R509 Fever, unspecified: Secondary | ICD-10-CM | POA: Diagnosis present

## 2022-09-07 LAB — POCT URINALYSIS DIP (MANUAL ENTRY)
Bilirubin, UA: NEGATIVE
Glucose, UA: NEGATIVE mg/dL
Ketones, POC UA: NEGATIVE mg/dL
Leukocytes, UA: NEGATIVE
Nitrite, UA: NEGATIVE
Protein Ur, POC: NEGATIVE mg/dL
Spec Grav, UA: 1.015 (ref 1.010–1.025)
Urobilinogen, UA: 0.2 E.U./dL
pH, UA: 6.5 (ref 5.0–8.0)

## 2022-09-07 LAB — BASIC METABOLIC PANEL
Anion gap: 11 (ref 5–15)
BUN: 11 mg/dL (ref 8–23)
CO2: 27 mmol/L (ref 22–32)
Calcium: 8.4 mg/dL — ABNORMAL LOW (ref 8.9–10.3)
Chloride: 95 mmol/L — ABNORMAL LOW (ref 98–111)
Creatinine, Ser: 1.07 mg/dL (ref 0.61–1.24)
GFR, Estimated: >60 mL/min (ref >60)
Glucose, Bld: 113 mg/dL — ABNORMAL HIGH (ref 70–99)
Potassium: 3.6 mmol/L (ref 3.5–5.1)
Sodium: 133 mmol/L — ABNORMAL LOW (ref 135–145)

## 2022-09-07 LAB — CBC WITH DIFFERENTIAL/PLATELET
Abs Immature Granulocytes: 0.07 10*3/uL (ref 0.00–0.07)
Basophils Absolute: 0 10*3/uL (ref 0.0–0.1)
Basophils Relative: 0 %
Eosinophils Absolute: 0 10*3/uL (ref 0.0–0.5)
Eosinophils Relative: 0 %
HCT: 38.3 % — ABNORMAL LOW (ref 39.0–52.0)
Hemoglobin: 12.7 g/dL — ABNORMAL LOW (ref 13.0–17.0)
Immature Granulocytes: 1 %
Lymphocytes Relative: 13 %
Lymphs Abs: 2 10*3/uL (ref 0.7–4.0)
MCH: 29.4 pg (ref 26.0–34.0)
MCHC: 33.2 g/dL (ref 30.0–36.0)
MCV: 88.7 fL (ref 80.0–100.0)
Monocytes Absolute: 1.8 10*3/uL — ABNORMAL HIGH (ref 0.1–1.0)
Monocytes Relative: 12 %
Neutro Abs: 11.4 10*3/uL — ABNORMAL HIGH (ref 1.7–7.7)
Neutrophils Relative %: 74 %
Platelets: 300 10*3/uL (ref 150–400)
RBC: 4.32 MIL/uL (ref 4.22–5.81)
RDW: 12.7 % (ref 11.5–15.5)
WBC: 15.4 10*3/uL — ABNORMAL HIGH (ref 4.0–10.5)
nRBC: 0 % (ref 0.0–0.2)

## 2022-09-07 LAB — URINALYSIS, W/ REFLEX TO CULTURE (INFECTION SUSPECTED)
Bilirubin Urine: NEGATIVE
Glucose, UA: NEGATIVE mg/dL
Ketones, ur: NEGATIVE mg/dL
Leukocytes,Ua: NEGATIVE
Nitrite: NEGATIVE
Protein, ur: NEGATIVE mg/dL
Specific Gravity, Urine: >1.046 — ABNORMAL HIGH (ref 1.005–1.030)
pH: 6 (ref 5.0–8.0)

## 2022-09-07 MED ORDER — IOHEXOL 300 MG/ML  SOLN
100.0000 mL | Freq: Once | INTRAMUSCULAR | Status: AC | PRN
  Administered 2022-09-07: 100 mL via INTRAVENOUS

## 2022-09-07 MED ORDER — ACETAMINOPHEN 325 MG PO TABS
650.0000 mg | ORAL_TABLET | Freq: Four times a day (QID) | ORAL | Status: DC | PRN
  Administered 2022-09-07 – 2022-09-10 (×4): 650 mg via ORAL
  Filled 2022-09-07 (×4): qty 2

## 2022-09-07 MED ORDER — SODIUM CHLORIDE 0.9 % IV SOLN: INTRAVENOUS | Status: DC

## 2022-09-07 MED ORDER — ACETAMINOPHEN 650 MG RE SUPP: 650.0000 mg | Freq: Four times a day (QID) | RECTAL | Status: DC | PRN

## 2022-09-07 MED ORDER — SODIUM CHLORIDE 0.9 % IV SOLN: Freq: Once | INTRAVENOUS | Status: AC

## 2022-09-07 MED ORDER — ONDANSETRON HCL 4 MG/2ML IJ SOLN: 4.0000 mg | Freq: Four times a day (QID) | INTRAMUSCULAR | Status: DC | PRN

## 2022-09-07 MED ORDER — MORPHINE SULFATE (PF) 2 MG/ML IV SOLN
2.0000 mg | INTRAVENOUS | Status: DC | PRN
  Administered 2022-09-08 (×3): 2 mg via INTRAVENOUS
  Filled 2022-09-07 (×3): qty 1

## 2022-09-07 MED ORDER — MORPHINE SULFATE (PF) 4 MG/ML IV SOLN
4.0000 mg | Freq: Once | INTRAVENOUS | Status: AC
  Administered 2022-09-07: 4 mg via INTRAVENOUS
  Filled 2022-09-07: qty 1

## 2022-09-07 MED ORDER — SODIUM CHLORIDE 0.9 % IV BOLUS
500.0000 mL | Freq: Once | INTRAVENOUS | Status: AC
  Administered 2022-09-07: 500 mL via INTRAVENOUS

## 2022-09-07 MED ORDER — PIPERACILLIN-TAZOBACTAM 3.375 G IVPB 30 MIN
3.3750 g | Freq: Once | INTRAVENOUS | Status: AC
  Administered 2022-09-07: 3.375 g via INTRAVENOUS
  Filled 2022-09-07: qty 50

## 2022-09-07 MED ORDER — OXYCODONE HCL 5 MG PO TABS
5.0000 mg | ORAL_TABLET | ORAL | Status: DC | PRN
  Administered 2022-09-07 – 2022-09-10 (×6): 5 mg via ORAL
  Filled 2022-09-07 (×6): qty 1

## 2022-09-07 MED ORDER — ONDANSETRON HCL 4 MG PO TABS: 4.0000 mg | ORAL_TABLET | Freq: Four times a day (QID) | ORAL | Status: DC | PRN

## 2022-09-07 NOTE — ED Notes (Signed)
Pt ambulatory to restroom to attempt urine sample

## 2022-09-07 NOTE — ED Triage Notes (Signed)
Pt c/o lower abd pain and lower back pain for a few days. Pt states he is not able to urinate like normal. UC sent pt over for blood in urine.

## 2022-09-07 NOTE — ED Provider Notes (Signed)
[facility] Provider Note   CSN: [id number] Arrival date & time: 09/07/22  2001     History {Add pertinent medical, surgical, social history, OB history to HPI:1} Chief Complaint  Patient presents with   Abdominal Pain    James Blake is a 62 y.o. male.  He is here with a complaint of lower abdominal pain and back pain.  Michela Pitcher it started a few weeks ago and he thought it was constipation.  After his bowels moved he had recurrence of the pain in his lower abdomen in his groin bilaterally.  It is associated with pain with urination and difficulty initiating a stream.  He is also had subjective fevers and chills.  No prior history of UTI.  Went to urgent care today and they told him he had blood in his urine and recommended he come here.  No trauma.  Rates the pain is severe worse with movement  The history is provided by the patient.  Abdominal Pain Pain location:  LLQ, RLQ and suprapubic Pain quality: aching   Pain radiates to:  Back Pain severity:  Severe Onset quality:  Gradual Duration:  2 weeks Timing:  Intermittent Progression:  Worsening Chronicity:  New Context: not trauma   Relieved by:  Nothing Worsened by:  Movement and urination Ineffective treatments:  Not moving Associated symptoms: chills, constipation, dysuria, fever and nausea   Associated symptoms: no cough, no diarrhea, no shortness of breath and no vomiting        Home Medications Prior to Admission medications   Medication Sig Start Date End Date Taking? Authorizing Provider  ibuprofen (ADVIL) 200 MG tablet Take 800 mg by mouth every 8 (eight) hours as needed for moderate pain.    [provider]      Allergies    Patient has no known allergies.    Review of Systems   Review of Systems  Constitutional:  Positive for chills and fever.  Respiratory:  Negative for cough and shortness of breath.   Gastrointestinal:  Positive for abdominal pain,  constipation and nausea. Negative for diarrhea and vomiting.  Genitourinary:  Positive for dysuria.    Physical Exam Updated Vital Signs BP 109/79 (BP Location: Right Arm)   Pulse 79   Temp 99.2 F (37.3 C) (Oral)   Resp 18   Ht '5\' 11"'$  (1.803 m)   Wt 77.1 kg   SpO2 96%   BMI 23.71 kg/m  Physical Exam Vitals and nursing note reviewed.  Constitutional:      General: He is not in acute distress.    Appearance: He is well-developed.  HENT:     Head: Normocephalic and atraumatic.  Eyes:     Conjunctiva/sclera: Conjunctivae normal.  Cardiovascular:     Rate and Rhythm: Normal rate and regular rhythm.     Heart sounds: No murmur heard. Pulmonary:     Effort: Pulmonary effort is normal. No respiratory distress.     Breath sounds: Normal breath sounds.  Abdominal:     Palpations: Abdomen is soft.     Tenderness: There is abdominal tenderness in the right lower quadrant, suprapubic area and left lower quadrant. There is no guarding or rebound.  Musculoskeletal:        General: No swelling.     Cervical back: Neck supple.  Skin:    General: Skin is warm and dry.     Capillary Refill: Capillary refill takes less than 2 seconds.  Neurological:  General: No focal deficit present.     Mental Status: He is alert.     ED Results / Procedures / Treatments   Labs (all labs ordered are listed, but only abnormal results are displayed) Labs Reviewed - No data to display  EKG None  Radiology No results found.  Procedures Procedures  {Document cardiac monitor, telemetry assessment procedure when appropriate:1}  Medications Ordered in ED Medications  sodium chloride 0.9 % bolus 500 mL (has no administration in time range)  morphine (PF) 4 MG/ML injection 4 mg (has no administration in time range)    ED Course/ Medical Decision Making/ A&P   {   Click here for ABCD2, HEART and other calculatorsREFRESH Note before signing :1}                          Medical Decision  Making Amount and/or Complexity of Data Reviewed Labs: ordered.  Risk Prescription drug management.   This patient complains of ***; this involves an extensive number of treatment Options and is a complaint that carries with it a high risk of complications and morbidity. The differential includes ***  I ordered, reviewed and interpreted labs, which included *** I ordered medication *** and reviewed PMP when indicated. I ordered imaging studies which included *** and I independently    visualized and interpreted imaging which showed *** Additional history obtained from *** Previous records obtained and reviewed *** I consulted *** and discussed lab and imaging findings and discussed disposition.  Cardiac monitoring reviewed, *** Social determinants considered, *** Critical Interventions: ***  After the interventions stated above, I reevaluated the patient and found *** Admission and further testing considered, ***   {Document critical care time when appropriate:1} {Document review of labs and clinical decision tools ie heart score, Chads2Vasc2 etc:1}  {Document your independent review of radiology images, and any outside records:1} {Document your discussion with family members, caretakers, and with consultants:1} {Document social determinants of health affecting pt's care:1} {Document your decision making why or why not admission, treatments were needed:1} Final Clinical Impression(s) / ED Diagnoses Final diagnoses:  None    Rx / DC Orders ED Discharge Orders     None

## 2022-09-07 NOTE — ED Triage Notes (Signed)
Constipation that started 2 weeks ago. Now patient is having pain in groin, extreme pain when trying to urinate. Patient states it is very hard for him to urinate for 3 days with lower abdominal pain, does have a history of hernia. Was able to urinate today 4 times but first 3 times it was only a few drops then the last time before he came here it was a small amount. Patient states he has been drinking about 60 oz a day and is still very thirsty.

## 2022-09-07 NOTE — ED Notes (Signed)
Pt given urinal to provide sample 

## 2022-09-07 NOTE — ED Notes (Signed)
Patient is being discharged from the Urgent Care and sent to the Emergency Department via [facility] . Per Provider Valeda Malm., patient is in need of higher level of care due to pain while urinating, chills, lower abdominal pain. Patient is aware and verbalizes understanding of plan of care.  Vitals:   09/07/22 1910  BP: 102/71  Pulse: 89  Resp: 18  Temp: 99.9 F (37.7 C)  SpO2: 95%

## 2022-09-07 NOTE — ED Notes (Signed)
Pt taken to CT.

## 2022-09-08 ENCOUNTER — Encounter (HOSPITAL_COMMUNITY): Payer: Self-pay | Admitting: Family Medicine

## 2022-09-08 ENCOUNTER — Inpatient Hospital Stay (HOSPITAL_COMMUNITY)
Admit: 2022-09-08 | Discharge: 2022-09-08 | Disposition: A | Discharge status: Home / Self Care | Payer: 59 | Attending: Family Medicine | Admitting: Family Medicine

## 2022-09-08 DIAGNOSIS — F17200 Nicotine dependence, unspecified, uncomplicated: Secondary | ICD-10-CM | POA: Diagnosis present

## 2022-09-08 DIAGNOSIS — K5720 Diverticulitis of large intestine with perforation and abscess without bleeding: Secondary | ICD-10-CM

## 2022-09-08 LAB — COMPREHENSIVE METABOLIC PANEL
ALT: 16 U/L (ref 0–44)
AST: 13 U/L — ABNORMAL LOW (ref 15–41)
Albumin: 3 g/dL — ABNORMAL LOW (ref 3.5–5.0)
Alkaline Phosphatase: 50 U/L (ref 38–126)
Anion gap: 9 (ref 5–15)
BUN: 12 mg/dL (ref 8–23)
CO2: 25 mmol/L (ref 22–32)
Calcium: 8.1 mg/dL — ABNORMAL LOW (ref 8.9–10.3)
Chloride: 98 mmol/L (ref 98–111)
Creatinine, Ser: 0.98 mg/dL (ref 0.61–1.24)
GFR, Estimated: >60 mL/min (ref >60)
Glucose, Bld: 115 mg/dL — ABNORMAL HIGH (ref 70–99)
Potassium: 3.7 mmol/L (ref 3.5–5.1)
Sodium: 132 mmol/L — ABNORMAL LOW (ref 135–145)
Total Bilirubin: 0.8 mg/dL (ref 0.3–1.2)
Total Protein: 6.7 g/dL (ref 6.5–8.1)

## 2022-09-08 LAB — CBC WITH DIFFERENTIAL/PLATELET
Abs Immature Granulocytes: 0.05 10*3/uL (ref 0.00–0.07)
Basophils Absolute: 0 10*3/uL (ref 0.0–0.1)
Basophils Relative: 0 %
Eosinophils Absolute: 0 10*3/uL (ref 0.0–0.5)
Eosinophils Relative: 0 %
HCT: 36.5 % — ABNORMAL LOW (ref 39.0–52.0)
Hemoglobin: 11.9 g/dL — ABNORMAL LOW (ref 13.0–17.0)
Immature Granulocytes: 0 %
Lymphocytes Relative: 11 %
Lymphs Abs: 1.7 10*3/uL (ref 0.7–4.0)
MCH: 29.3 pg (ref 26.0–34.0)
MCHC: 32.6 g/dL (ref 30.0–36.0)
MCV: 89.9 fL (ref 80.0–100.0)
Monocytes Absolute: 1.7 10*3/uL — ABNORMAL HIGH (ref 0.1–1.0)
Monocytes Relative: 11 %
Neutro Abs: 11.2 10*3/uL — ABNORMAL HIGH (ref 1.7–7.7)
Neutrophils Relative %: 78 %
Platelets: 266 10*3/uL (ref 150–400)
RBC: 4.06 MIL/uL — ABNORMAL LOW (ref 4.22–5.81)
RDW: 12.8 % (ref 11.5–15.5)
WBC: 14.7 10*3/uL — ABNORMAL HIGH (ref 4.0–10.5)
nRBC: 0 % (ref 0.0–0.2)

## 2022-09-08 LAB — PROTIME-INR
INR: 1.1 (ref 0.8–1.2)
Prothrombin Time: 13.9 seconds (ref 11.4–15.2)

## 2022-09-08 LAB — MAGNESIUM: Magnesium: 2.3 mg/dL (ref 1.7–2.4)

## 2022-09-08 LAB — HIV ANTIBODY (ROUTINE TESTING W REFLEX): HIV Screen 4th Generation wRfx: NONREACTIVE

## 2022-09-08 MED ORDER — DEXTROSE-NACL 5-0.45 % IV SOLN: INTRAVENOUS | Status: DC

## 2022-09-08 MED ORDER — HEPARIN SODIUM (PORCINE) 5000 UNIT/ML IJ SOLN: 5000.0000 [IU] | Freq: Three times a day (TID) | INTRAMUSCULAR | Status: DC

## 2022-09-08 MED ORDER — MORPHINE SULFATE (PF) 2 MG/ML IV SOLN: 2.0000 mg | INTRAVENOUS | Status: DC | PRN

## 2022-09-08 MED ORDER — MORPHINE SULFATE (PF) 2 MG/ML IV SOLN
2.0000 mg | INTRAVENOUS | Status: DC | PRN
  Administered 2022-09-08: 2 mg via SUBCUTANEOUS
  Filled 2022-09-08: qty 1

## 2022-09-08 MED ORDER — FENTANYL CITRATE (PF) 100 MCG/2ML IJ SOLN
INTRAMUSCULAR | Status: AC
  Filled 2022-09-08: qty 2

## 2022-09-08 MED ORDER — SODIUM CHLORIDE 0.9 % IV SOLN: INTRAVENOUS | Status: DC

## 2022-09-08 MED ORDER — PIPERACILLIN-TAZOBACTAM 3.375 G IVPB
3.3750 g | Freq: Three times a day (TID) | INTRAVENOUS | Status: DC
  Administered 2022-09-08 – 2022-09-10 (×7): 3.375 g via INTRAVENOUS
  Filled 2022-09-08 (×7): qty 50

## 2022-09-08 MED ORDER — MIDAZOLAM HCL 2 MG/2ML IJ SOLN
INTRAMUSCULAR | Status: AC
  Filled 2022-09-08: qty 2

## 2022-09-08 MED ORDER — MIDAZOLAM HCL 2 MG/2ML IJ SOLN
INTRAMUSCULAR | Status: AC | PRN
  Administered 2022-09-08 (×2): 1 mg via INTRAVENOUS

## 2022-09-08 MED ORDER — PIPERACILLIN-TAZOBACTAM 3.375 G IVPB 30 MIN: 3.3750 g | Freq: Three times a day (TID) | INTRAVENOUS | Status: DC

## 2022-09-08 MED ORDER — LIDOCAINE HCL (PF) 1 % IJ SOLN
10.0000 mL | Freq: Once | INTRAMUSCULAR | Status: AC
  Administered 2022-09-08: 10 mL
  Filled 2022-09-08: qty 10

## 2022-09-08 MED ORDER — FENTANYL CITRATE (PF) 100 MCG/2ML IJ SOLN
INTRAMUSCULAR | Status: AC | PRN
  Administered 2022-09-08: 25 ug via INTRAVENOUS
  Administered 2022-09-08: 50 ug via INTRAVENOUS
  Administered 2022-09-08: 25 ug via INTRAVENOUS
  Administered 2022-09-08: 50 ug via INTRAVENOUS

## 2022-09-08 MED ORDER — MORPHINE SULFATE (PF) 2 MG/ML IV SOLN
2.0000 mg | INTRAVENOUS | Status: DC | PRN
  Administered 2022-09-08 – 2022-09-09 (×3): 2 mg via INTRAVENOUS
  Filled 2022-09-08 (×3): qty 1

## 2022-09-08 MED ORDER — NICOTINE 21 MG/24HR TD PT24
21.0000 mg | MEDICATED_PATCH | Freq: Every day | TRANSDERMAL | Status: DC
  Administered 2022-09-08 – 2022-09-10 (×3): 21 mg via TRANSDERMAL
  Filled 2022-09-08 (×3): qty 1

## 2022-09-08 NOTE — Consult Note (Addendum)
[facility] Consult  Reason for Consult: Diverticulitis with pericolonic abscess Referring Physician: Carlynn Purl, DO  Chief Complaint   Abdominal Pain     HPI: James Blake is a 62 y.o. male with medical history significant of tobacco use disorder and gout. He presented to ED with abdominal pain that started as constipations a few weeks ago. Constipation resolved with Miralax but abdminal pain worsened and he began to experience fever and chills. He visited the urgent care and had blood in his urine, which prompted them to transfer him to the ED. CT imaging was ordered and revealed diverticulitis with a pericolonic abscess and focal cystitis. Patient reports last BM was Monday and had a runny consistency. He has a decreased appetite and his last meal was a nibble of chicken soup on Monday. The abdominal pain has improved during his stay with morphine, and is made worse with supine position. Patient denies any melena or hematochezia.    Past Medical History:  Diagnosis Date   Substance abuse ([place])     Past Surgical History:  Procedure Laterality Date   DENTAL SURGERY     HERNIA REPAIR      Family History  Problem Relation Age of Onset   Diabetes Father    Heart failure Father     Social History   Tobacco Use   Smoking status: Every Day    Packs/day: 2.00    Years: 40.00    Total pack years: 80.00    Types: Cigarettes   Smokeless tobacco: Never  Substance Use Topics   Alcohol use: Not Currently    Comment: rarely drinks etoh    Drug use: Yes    Frequency: 7.0 times per week    Types: Cocaine    Comment: smokes crack every day     Medications: I have reviewed the patient's current medications. Prior to Admission:  Medications Prior to Admission  Medication Sig Dispense Refill Last Dose   acetaminophen (TYLENOL) 325 MG tablet Take 650 mg by mouth every 6 (six) hours as needed for moderate pain.   Past Week   Aspirin-Acetaminophen (GOODYS  BODY PAIN PO) Take 1 packet by mouth as needed (pain).   Past Week   ibuprofen (ADVIL) 200 MG tablet Take 400 mg by mouth every 8 (eight) hours as needed for moderate pain.   Past Week   Scheduled:  nicotine  21 mg Transdermal Daily   Continuous:  sodium chloride     piperacillin-tazobactam (ZOSYN)  IV 3.375 g (09/08/22 0526)   [id number] **OR** acetaminophen, morphine injection, ondansetron **OR** ondansetron (ZOFRAN) IV, oxyCODONE Anti-infectives (From admission, onward)    Start     Dose/Rate Route Frequency Ordered Stop   09/08/22 0600  piperacillin-tazobactam (ZOSYN) IVPB 3.375 g  Status:  Discontinued        3.375 g 100 mL/hr over 30 Minutes Intravenous Every 8 hours 09/08/22 0418 09/08/22 0420   09/08/22 0600  piperacillin-tazobactam (ZOSYN) IVPB 3.375 g        3.375 g 12.5 mL/hr over 240 Minutes Intravenous Every 8 hours 09/08/22 0420     09/07/22 2230  piperacillin-tazobactam (ZOSYN) IVPB 3.375 g        3.375 g 100 mL/hr over 30 Minutes Intravenous  Once 09/07/22 2220 09/07/22 2314       No Known Allergies   ROS:  Pertinent items are noted in HPI.  Blood pressure 101/71, pulse 64, temperature 98.5 F (36.9 C), temperature source Oral, resp. rate 14, height 5'  11" (1.803 m), weight 71.8 kg, SpO2 97 %. Physical Exam HENT:     Head: Normocephalic and atraumatic.  Pulmonary:     Effort: Pulmonary effort is normal.  Abdominal:     General: A surgical scar is present.     Palpations: Abdomen is soft.     Tenderness: There is abdominal tenderness in the suprapubic area. There is guarding.     Hernia: A hernia is present. Hernia is present in the left inguinal area.     Comments: R. Inguinal hernia scar   Skin:    General: Skin is warm and dry.  Neurological:     Mental Status: He is alert.  Psychiatric:        Mood and Affect: Mood normal.     Results: Results for orders placed or performed during the hospital encounter of 09/07/22 (from the past 48  hour(s))  Basic metabolic panel     Status: Abnormal   Collection Time: 09/07/22  8:33 PM  Result Value Ref Range   Sodium 133 (L) 135 - 145 mmol/L   Potassium 3.6 3.5 - 5.1 mmol/L   Chloride 95 (L) 98 - 111 mmol/L   CO2 27 22 - 32 mmol/L   Glucose, Bld 113 (H) 70 - 99 mg/dL    Comment: Glucose reference range applies only to samples taken after fasting for at least 8 hours.   BUN 11 8 - 23 mg/dL   Creatinine, Ser 1.07 0.61 - 1.24 mg/dL   Calcium 8.4 (L) 8.9 - 10.3 mg/dL   GFR, Estimated >60 >60 mL/min    Comment: (NOTE) Calculated using the CKD-EPI Creatinine Equation (2021)    Anion gap 11 5 - 15    Comment: Performed at [facility], [address], [place], [place] [address]  CBC with Differential     Status: Abnormal   Collection Time: 09/07/22  8:33 PM  Result Value Ref Range   WBC 15.4 (H) 4.0 - 10.5 K/uL   RBC 4.32 4.22 - 5.81 MIL/uL   Hemoglobin 12.7 (L) 13.0 - 17.0 g/dL   HCT 38.3 (L) 39.0 - 52.0 %   MCV 88.7 80.0 - 100.0 fL   MCH 29.4 26.0 - 34.0 pg   MCHC 33.2 30.0 - 36.0 g/dL   RDW 12.7 11.5 - 15.5 %   Platelets 300 150 - 400 K/uL   nRBC 0.0 0.0 - 0.2 %   Neutrophils Relative % 74 %   Neutro Abs 11.4 (H) 1.7 - 7.7 K/uL   Lymphocytes Relative 13 %   Lymphs Abs 2.0 0.7 - 4.0 K/uL   Monocytes Relative 12 %   Monocytes Absolute 1.8 (H) 0.1 - 1.0 K/uL   Eosinophils Relative 0 %   Eosinophils Absolute 0.0 0.0 - 0.5 K/uL   Basophils Relative 0 %   Basophils Absolute 0.0 0.0 - 0.1 K/uL   Immature Granulocytes 1 %   Abs Immature Granulocytes 0.07 0.00 - 0.07 K/uL    Comment: Performed at [facility], [address] [place], [place] [address]  Urinalysis, w/ Reflex to Culture (Infection Suspected) -Urine, Clean Catch     Status: Abnormal   Collection Time: 09/07/22 11:38 PM  Result Value Ref Range   Specimen Source URINE, CLEAN CATCH    Color, Urine YELLOW YELLOW   APPearance CLEAR CLEAR   Specific Gravity, Urine >1.046 (H) 1.005 - 1.030   pH 6.0 5.0 - 8.0    Glucose, UA NEGATIVE NEGATIVE mg/dL   Hgb urine  dipstick SMALL (A) NEGATIVE   Bilirubin Urine NEGATIVE NEGATIVE   Ketones, ur NEGATIVE NEGATIVE mg/dL   Protein, ur NEGATIVE NEGATIVE mg/dL   Nitrite NEGATIVE NEGATIVE   Leukocytes,Ua NEGATIVE NEGATIVE   RBC / HPF 0-5 0 - 5 RBC/hpf   WBC, UA 0-5 0 - 5 WBC/hpf    Comment:        Reflex urine culture not performed if WBC <=10, OR if Squamous epithelial cells >5. If Squamous epithelial cells >5 suggest recollection.    Bacteria, UA RARE (A) NONE SEEN   Squamous Epithelial / HPF 0-5 0 - 5 /HPF    Comment: Performed at [facility], [address] [place], [place] [address]  HIV Antibody (routine testing w rflx)     Status: None   Collection Time: 09/08/22  4:00 AM  Result Value Ref Range   HIV Screen 4th Generation wRfx Non Reactive Non Reactive    Comment: Performed at [facility], [place] [address] [place], [place] [address]  Comprehensive metabolic panel     Status: Abnormal   Collection Time: 09/08/22  4:00 AM  Result Value Ref Range   Sodium 132 (L) 135 - 145 mmol/L   Potassium 3.7 3.5 - 5.1 mmol/L   Chloride 98 98 - 111 mmol/L   CO2 25 22 - 32 mmol/L   Glucose, Bld 115 (H) 70 - 99 mg/dL    Comment: Glucose reference range applies only to samples taken after fasting for at least 8 hours.   BUN 12 8 - 23 mg/dL   Creatinine, Ser 0.98 0.61 - 1.24 mg/dL   Calcium 8.1 (L) 8.9 - 10.3 mg/dL   Total Protein 6.7 6.5 - 8.1 g/dL   Albumin 3.0 (L) 3.5 - 5.0 g/dL   AST 13 (L) 15 - 41 U/L   ALT 16 0 - 44 U/L   Alkaline Phosphatase 50 38 - 126 U/L   Total Bilirubin 0.8 0.3 - 1.2 mg/dL   GFR, Estimated >60 >60 mL/min    Comment: (NOTE) Calculated using the CKD-EPI Creatinine Equation (2021)    Anion gap 9 5 - 15    Comment: Performed at [facility], [address] [place], [place] [address]  Magnesium     Status: None   Collection Time: 09/08/22  4:00 AM  Result Value Ref Range   Magnesium 2.3 1.7 - 2.4 mg/dL    Comment:  Performed at [facility], [address], [place], [place] [address]  CBC with Differential/Platelet     Status: Abnormal   Collection Time: 09/08/22  4:00 AM  Result Value Ref Range   WBC 14.7 (H) 4.0 - 10.5 K/uL   RBC 4.06 (L) 4.22 - 5.81 MIL/uL   Hemoglobin 11.9 (L) 13.0 - 17.0 g/dL   HCT 36.5 (L) 39.0 - 52.0 %   MCV 89.9 80.0 - 100.0 fL   MCH 29.3 26.0 - 34.0 pg   MCHC 32.6 30.0 - 36.0 g/dL   RDW 12.8 11.5 - 15.5 %   Platelets 266 150 - 400 K/uL   nRBC 0.0 0.0 - 0.2 %   Neutrophils Relative % 78 %   Neutro Abs 11.2 (H) 1.7 - 7.7 K/uL   Lymphocytes Relative 11 %   Lymphs Abs 1.7 0.7 - 4.0 K/uL   Monocytes Relative 11 %   Monocytes Absolute 1.7 (H) 0.1 - 1.0 K/uL   Eosinophils Relative 0 %   Eosinophils Absolute 0.0 0.0 - 0.5 K/uL   Basophils Relative  0 %   Basophils Absolute 0.0 0.0 - 0.1 K/uL   Immature Granulocytes 0 %   Abs Immature Granulocytes 0.05 0.00 - 0.07 K/uL    Comment: Performed at [facility], [address], [place], [place] [address]  Protime-INR     Status: None   Collection Time: 09/08/22  8:04 AM  Result Value Ref Range   Prothrombin Time 13.9 11.4 - 15.2 seconds   INR 1.1 0.8 - 1.2    Comment: (NOTE) INR goal varies based on device and disease states. Performed at [facility], [address], [place], [place] [address]     CT Abdomen Pelvis W Contrast  Result Date: 09/07/2022 CLINICAL DATA:  Left lower quadrant abdominal pain. EXAM: CT ABDOMEN AND PELVIS WITH CONTRAST TECHNIQUE: Multidetector CT imaging of the abdomen and pelvis was performed using the standard protocol following bolus administration of intravenous contrast. RADIATION DOSE REDUCTION: This exam was performed according to the departmental dose-optimization program which includes automated exposure control, adjustment of the mA and/or kV according to patient size and/or use of iterative reconstruction technique. CONTRAST:  14m OMNIPAQUE IOHEXOL 300 MG/ML  SOLN COMPARISON:  January 07, 2006  FINDINGS: Lower chest: No acute abnormality. Hepatobiliary: No focal liver abnormality is seen. No gallstones, gallbladder wall thickening, or biliary dilatation. Pancreas: Unremarkable. No pancreatic ductal dilatation or surrounding inflammatory changes. Spleen: Normal in size without focal abnormality. Adrenals/Urinary Tract: Adrenal glands are unremarkable. Kidneys are normal, without renal calculi, focal lesion, or hydronephrosis. The urinary bladder is moderately distended with mild posterior urinary bladder wall thickening seen. Stomach/Bowel: Stomach is within normal limits. Appendix appears normal. No evidence of bowel dilatation. Numerous diverticula are seen throughout a markedly thickened and inflamed sigmoid colon, with a 6.3 cm x 7.1 cm x 5.5 cm pericolonic abscess seen along the mid to distal sigmoid colon. Vascular/Lymphatic: Aortic atherosclerosis. Calcified right inguinal lymph nodes are seen. Reproductive: Prostate gland is mildly enlarged. Other: No abdominal wall hernia or abnormality. No abdominopelvic ascites. Musculoskeletal: No acute or significant osseous findings. IMPRESSION: 1. Marked severity sigmoid diverticulitis with a 6.3 cm x 7.1 cm x 5.5 cm pericolonic abscess. 2. Mild focal cystitis involving the adjacent portion of the urinary bladder cannot be excluded. Correlation with urinalysis is recommended. 3. Prostatomegaly. 4. Aortic atherosclerosis. Aortic Atherosclerosis (ICD10-I70.0). Electronically Signed   By: TVirgina NorfolkM.D.   On: 09/07/2022 22:16     Assessment & Plan:  James PACHAis a 62y.o. male who was admitted with a colonic diverticular abscess.  -Continue prn pain control and antiemetics -Continue Rocephin and Flagyl -Follow clear liquid diet -Consider scheduling colonoscopy after recovery from complicated diverticulitis -Follow up if signs of acute abdomen or colonic perforation occur.  -No surgical intervention is indicated at this time.   James Blake Medical Student 09/08/2022, 1:02 PM

## 2022-09-08 NOTE — Procedures (Signed)
Interventional Radiology Procedure Note  Procedure: CT guided pelvic drain placement  Findings: Please refer to procedural dictation for full description. 10 Fr drain in left pelvis into diverticular abscess yielding approximately 10 mL of purulent fluid, sample sent for culture.  Drain connected to bulb suction.    Complications: None immediate  Estimated Blood Loss: < 5 mL  Recommendations: Keep to bulb suction for now. Follow up culture.   Ruthann Cancer, MD

## 2022-09-08 NOTE — Assessment & Plan Note (Addendum)
-   Smokes 2 packs/day - Starting with one 21 mg nicotine patch, but may need another

## 2022-09-08 NOTE — Sedation Documentation (Signed)
[facility] ambulance crew here to transport patient back to [facility]. Handoff given. Patient ambulatory to [facility] stretcher.

## 2022-09-08 NOTE — ED Provider Notes (Signed)
[facility]    CSN: [id number] Arrival date & time: 09/07/22  1826      History   Chief Complaint Chief Complaint  Patient presents with   Abdominal Pain    HPI James Blake is a 62 y.o. male.   Patient presenting today with 2-week history of constipation and no severe suprapubic pain, unbearable dysuria, chills, sweats.  Now also having some urinary hesitancy, only able to do several drops at a time.  Denies nausea, vomiting, chest pain, shortness of breath, new foods or medications, recent sick contacts.  No past history of similar episodes.  So far not trying anything over-the-counter for symptoms.    Past Medical History:  Diagnosis Date   Substance abuse [facility])     Patient Active Problem List   Diagnosis Date Noted   Tobacco use disorder 09/08/2022   Colonic diverticular abscess 09/07/2022    Past Surgical History:  Procedure Laterality Date   DENTAL SURGERY     HERNIA REPAIR         Home Medications    Prior to Admission medications   Medication Sig Start Date End Date Taking? Authorizing Provider  acetaminophen (TYLENOL) 325 MG tablet Take 650 mg by mouth every 6 (six) hours as needed for moderate pain.    [provider]  Aspirin-Acetaminophen (GOODYS BODY PAIN PO) Take 1 packet by mouth as needed (pain).    [provider]  ibuprofen (ADVIL) 200 MG tablet Take 400 mg by mouth every 8 (eight) hours as needed for moderate pain.    [provider]    Family History Family History  Problem Relation Age of Onset   Diabetes Father    Heart failure Father     Social History Social History   Tobacco Use   Smoking status: Every Day    Packs/day: 2.00    Years: 40.00    Total pack years: 80.00    Types: Cigarettes   Smokeless tobacco: Never  Substance Use Topics   Alcohol use: Not Currently    Comment: rarely drinks etoh    Drug use: Yes    Frequency: 7.0 times per week    Types: Cocaine    Comment:  smokes crack every day      Allergies   Patient has no known allergies.   Review of Systems Review of Systems PER HPI  Physical Exam Triage Vital Signs ED Triage Vitals [09/07/22 1910]  Enc Vitals Group     BP 102/71     Pulse Rate 89     Resp 18     Temp 99.9 F (37.7 C)     Temp Source Oral     SpO2 95 %     Weight      Height      Head Circumference      Peak Flow      Pain Score 9     Pain Loc      Pain Edu?      Excl. in [place]?    No data found.  Updated Vital Signs BP 102/71 (BP Location: Right Arm)   Pulse 89   Temp 99.9 F (37.7 C) (Oral)   Resp 18   SpO2 95%   Visual Acuity Right Eye Distance:   Left Eye Distance:   Bilateral Distance:    Right Eye Near:   Left Eye Near:    Bilateral Near:     Physical Exam Vitals and nursing note reviewed.  Constitutional:      Appearance: Normal appearance.     Comments: Appears in significant pain, pale  HENT:     Head: Atraumatic.  Eyes:     Extraocular Movements: Extraocular movements intact.     Conjunctiva/sclera: Conjunctivae normal.  Cardiovascular:     Rate and Rhythm: Normal rate and regular rhythm.  Pulmonary:     Effort: Pulmonary effort is normal.     Breath sounds: Normal breath sounds.  Abdominal:     Comments: Bowel sounds hyperactive, severe tenderness to palpation to lower abdomen and suprapubic region, no distention but positive for guarding  Musculoskeletal:        General: Normal range of motion.     Cervical back: Normal range of motion and neck supple.  Skin:    General: Skin is warm.  Neurological:     General: No focal deficit present.     Mental Status: He is alert and oriented to person, place, and time.  Psychiatric:        Mood and Affect: Mood normal.        Thought Content: Thought content normal.        Judgment: Judgment normal.      UC Treatments / Results  Labs (all labs ordered are listed, but only abnormal results are displayed) Labs Reviewed  POCT  URINALYSIS DIP (MANUAL ENTRY) - Abnormal; Notable for the following components:      Result Value   Blood, UA trace-lysed (*)    All other components within normal limits    EKG   Radiology CT GUIDED PERITONEAL/RETROPERITONEAL FLUID DRAIN BY PERC CATH  Result Date: 09/08/2022 INDICATION: 62 year old male with history of diverticular abscess. EXAM: CT PERC DRAIN PERITONEAL ABCESS COMPARISON:  CT abdomen pelvis from 09/07/2022 MEDICATIONS: The patient is currently admitted to the hospital and receiving intravenous antibiotics. The antibiotics were administered within an appropriate time frame prior to the initiation of the procedure. ANESTHESIA/SEDATION: Moderate (conscious) sedation was employed during this procedure. A total of Versed 2 mg and Fentanyl 125 mcg was administered intravenously. Moderate Sedation Time: 13 minutes. The patient's level of consciousness and vital signs were monitored continuously by radiology nursing throughout the procedure under my direct supervision. CONTRAST:  None COMPLICATIONS: None immediate. PROCEDURE: RADIATION DOSE REDUCTION: This exam was performed according to the departmental dose-optimization program which includes automated exposure control, adjustment of the mA and/or kV according to patient size and/or use of iterative reconstruction technique. Informed written consent was obtained from the patient after a discussion of the risks, benefits and alternatives to treatment. The patient was placed supine on the CT gantry and a pre procedural CT was performed re-demonstrating the known abscess/fluid collection within the left hemipelvis. The procedure was planned. A timeout was performed prior to the initiation of the procedure. The left lower quadrant was prepped and draped in the usual sterile fashion. The overlying soft tissues were anesthetized with 1% lidocaine with epinephrine. Appropriate trajectory was planned with the use of a 22 gauge spinal needle. An 18  gauge trocar needle was advanced into the abscess/fluid collection and a short Amplatz super stiff wire was coiled within the collection. Appropriate positioning was confirmed with a limited CT scan. The tract was serially dilated allowing placement of a 10 [place] all-purpose drainage catheter. Appropriate positioning was confirmed with a limited postprocedural CT scan. Approximately 10 ml of purulent fluid was aspirated. The tube was connected to a bulb suction and sutured in place. A dressing was placed. The  patient tolerated the procedure well without immediate post procedural complication. IMPRESSION: Successful CT guided placement of a 10 French all purpose drain catheter into the left pelvic diverticular abscess with aspiration of approximately 10 mL of purulent fluid. Samples were sent to the laboratory as requested by the ordering clinical team. Ruthann Cancer, MD [facility] Radiology Electronically Signed   By: Ruthann Cancer M.D.   On: 09/08/2022 13:00   CT Abdomen Pelvis W Contrast  Result Date: 09/07/2022 CLINICAL DATA:  Left lower quadrant abdominal pain. EXAM: CT ABDOMEN AND PELVIS WITH CONTRAST TECHNIQUE: Multidetector CT imaging of the abdomen and pelvis was performed using the standard protocol following bolus administration of intravenous contrast. RADIATION DOSE REDUCTION: This exam was performed according to the departmental dose-optimization program which includes automated exposure control, adjustment of the mA and/or kV according to patient size and/or use of iterative reconstruction technique. CONTRAST:  167m OMNIPAQUE IOHEXOL 300 MG/ML  SOLN COMPARISON:  January 07, 2006 FINDINGS: Lower chest: No acute abnormality. Hepatobiliary: No focal liver abnormality is seen. No gallstones, gallbladder wall thickening, or biliary dilatation. Pancreas: Unremarkable. No pancreatic ductal dilatation or surrounding inflammatory changes. Spleen: Normal in  size without focal abnormality. Adrenals/Urinary Tract: Adrenal glands are unremarkable. Kidneys are normal, without renal calculi, focal lesion, or hydronephrosis. The urinary bladder is moderately distended with mild posterior urinary bladder wall thickening seen. Stomach/Bowel: Stomach is within normal limits. Appendix appears normal. No evidence of bowel dilatation. Numerous diverticula are seen throughout a markedly thickened and inflamed sigmoid colon, with a 6.3 cm x 7.1 cm x 5.5 cm pericolonic abscess seen along the mid to distal sigmoid colon. Vascular/Lymphatic: Aortic atherosclerosis. Calcified right inguinal lymph nodes are seen. Reproductive: Prostate gland is mildly enlarged. Other: No abdominal wall hernia or abnormality. No abdominopelvic ascites. Musculoskeletal: No acute or significant osseous findings. IMPRESSION: 1. Marked severity sigmoid diverticulitis with a 6.3 cm x 7.1 cm x 5.5 cm pericolonic abscess. 2. Mild focal cystitis involving the adjacent portion of the urinary bladder cannot be excluded. Correlation with urinalysis is recommended. 3. Prostatomegaly. 4. Aortic atherosclerosis. Aortic Atherosclerosis (ICD10-I70.0). Electronically Signed   By: TVirgina NorfolkM.D.   On: 09/07/2022 22:16    Procedures Procedures (including critical care time)  Medications Ordered in UC Medications - No data to display  Initial Impression / Assessment and Plan / UC Course  I have reviewed the triage vital signs and the nursing notes.  Pertinent labs & imaging results that were available during my care of the patient were reviewed by me and considered in my medical decision making (see chart for details).     Low-grade fever and borderline hypotension in triage and appears in severe pain.  Urinalysis without evidence of urinary tract infection, and given severity of symptoms and wide differential including some emergent potential causes recommended further evaluation in the emergency  department.  He is agreeable and has some family with him today that can take him via private vehicle to the emergency department.  He is hemodynamically stable for private vehicle transport at this time.  Final Clinical Impressions(s) / UC Diagnoses   Final diagnoses:  Suprapubic pain, acute  Bilateral flank pain  Constipation, unspecified constipation type  Urinary hesitancy   Discharge Instructions   None    ED Prescriptions   None    PDMP not reviewed this encounter.   LMerrie RoofEFargo PVermont02/29/24 [phone]

## 2022-09-08 NOTE — Progress Notes (Signed)
PROGRESS NOTE    Patient: James Blake                            PCP: Cory Munch, PA-C                    DOB: 11-02-1960            DOA: 09/07/2022 [id number]             DOS: 09/08/2022, 9:52 AM   LOS: 1 day   Date of Service: The patient was seen and examined on 09/08/2022  Subjective:   The patient was seen and examined this morning. Hemodynamically stable. NPO    Brief Narrative:    YUSSEF VONASEK is a 62 y.o. male with medical history significant of tobacco use disorder and no other known medical conditions, presents the ED with a chief complaint of abdominal pain.  Patient reports it all started a few weeks ago with constipation.    Patient reports that got progressively worse through the weekend.  He also started having fever and chills.   He went to urgent care and they saw blood in his urine so they sent him to the ER to rule out stone.  In the ED a CT of the abdomen was done that identified diverticulitis with a large pericolonic abscess.    He has not noticed any melena or hematochezia.  He noticed excessive flatulence.  He is very specific about his pain when he tries to void.  It feels like a pressure on his bladder and not like a burning sensation in his penis.  He reports it feels like something is pushing down on his bladder.    Patient does smoke about 2 packs/day.  He reports he knows that he has to quit but has not done it yet.  He reports he used to drink very heavily but quit 7 years ago.  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  His last dose was last week.  He has not been vaccinated for COVID or flu.  Patient is full code.  ED: Temp 99.2, heart rate 65-89, respiratory rate 18, blood pressure 102/66-138/79 Leukocytosis at 15.4, hemoglobin 12.7, platelets 300 Chemistries unremarkable CT abdomen pelvis shows diverticulitis with a 6 by 3 x 7 x 5.5 cm pericolonic abscess and mild focal cystitis Patient was  started on Zosyn, morphine, and given a 1.5 L saline bolus General surgery was consulted and recommended patient to be admitted to [facility] for IR to be consulted in the a.m. Admission requested for diverticulitis with abscess    Assessment & Plan:   Principal Problem:   Colonic diverticular abscess Active Problems:   Tobacco use disorder     Assessment and Plan: * Colonic diverticular abscess - stable this am  Blood pressure 101/71, pulse 64, temperature 98.5 F (36.9 C), SpO2 97 % on RA  - Abdominal pain improved with IV pian meds  - improved - fever/chills, leukocytosis - CT abdomen pelvis shows diverticulitis with 6 by 3 x 7 x 5.5 cm pericolonic abscess with mild focal cystitis - Continue Zosyn - Trend white blood cell count - General surgery will take over his care  - IR Consulted for drain placement at [facility]  - Continue IV fluids - N.p.o. - Pain control with pain scale - Continue to monitor  Tobacco use disorder -  Smokes 2 packs/day - Starting with one 21 mg nicotine patch, but may need another        ------------------------------------------------------------------------------------------------------------------------------------------------  DVT prophylaxis:  Place and maintain sequential compression device Start: 09/08/22 0726 SCDs Start: 09/08/22 0004   Code Status:   Code Status: Full Code  Family Communication: No family member present at bedside- attempt will be made to update daily  -Advance care planning has been discussed.   Admission status:   Status is: Inpatient Remains inpatient appropriate because: needing IV Abx and surgical intervention    Disposition: From  - home             Planning for discharge in 1-2 days: to   Procedures:   No admission procedures for hospital encounter.   Antimicrobials:  Anti-infectives (From admission, onward)    Start     Dose/Rate Route Frequency Ordered Stop   09/08/22 0600   piperacillin-tazobactam (ZOSYN) IVPB 3.375 g  Status:  Discontinued        3.375 g 100 mL/hr over 30 Minutes Intravenous Every 8 hours 09/08/22 0418 09/08/22 0420   09/08/22 0600  piperacillin-tazobactam (ZOSYN) IVPB 3.375 g        3.375 g 12.5 mL/hr over 240 Minutes Intravenous Every 8 hours 09/08/22 0420     09/07/22 2230  piperacillin-tazobactam (ZOSYN) IVPB 3.375 g        3.375 g 100 mL/hr over 30 Minutes Intravenous  Once 09/07/22 2220 09/07/22 2314        Medication:   nicotine  21 mg Transdermal Daily    acetaminophen **OR** acetaminophen, morphine injection, ondansetron **OR** ondansetron (ZOFRAN) IV, oxyCODONE   Objective:   Vitals:   09/08/22 0500 09/08/22 0527 09/08/22 0549 09/08/22 0554  BP: 129/84 129/74 101/71   Pulse: 65 63 64   Resp:  16 14   Temp:  98.6 F (37 C) 98.5 F (36.9 C)   TempSrc:   Oral   SpO2: 98% 97% 97%   Weight:    71.8 kg  Height:        Intake/Output Summary (Last 24 hours) at 09/08/2022 [id number] Last data filed at 09/08/2022 0554 Gross per 24 hour  Intake 500 ml  Output 80 ml  Net 420 ml   Filed Weights   09/07/22 2009 09/08/22 0554  Weight: 77.1 kg 71.8 kg     Physical examination:   Constitution:  Alert, cooperative, no distress,  Appears calm and comfortable  Psychiatric:   Normal and stable mood and affect, cognition intact,   HEENT:        Normocephalic, PERRL, otherwise with in Normal limits  Chest:         Chest symmetric Cardio vascular:  S1/S2, RRR, No murmure, No Rubs or Gallops  pulmonary: Clear to auscultation bilaterally, respirations unlabored, negative wheezes / crackles Abdomen: Soft, tender lower abd. Area..  non-distended, bowel sounds,no masses, no organomegaly Muscular skeletal: Limited exam - in bed, able to move all 4 extremities,   Neuro: CNII-XII intact. , normal motor and sensation, reflexes intact  Extremities: No pitting edema lower extremities, +2 pulses  Skin: Dry, warm to touch, negative for any  Rashes, No open wounds Wounds: per nursing documentation   ------------------------------------------------------------------------------------------------------------------------------------------    LABs:     Latest Ref Rng & Units 09/08/2022    4:00 AM 09/07/2022    8:33 PM 02/14/2020    6:18 PM  CBC  WBC 4.0 - 10.5 K/uL 14.7  15.4  12.5   Hemoglobin 13.0 -  17.0 g/dL 11.9  12.7  14.6   Hematocrit 39.0 - 52.0 % 36.5  38.3  42.3   Platelets 150 - 400 K/uL 266  300  225       Latest Ref Rng & Units 09/08/2022    4:00 AM 09/07/2022    8:33 PM 02/14/2020    6:18 PM  CMP  Glucose 70 - 99 mg/dL 115  113  101   BUN 8 - 23 mg/dL '12  11  9   '$ Creatinine 0.61 - 1.24 mg/dL 0.98  1.07  0.93   Sodium 135 - 145 mmol/L 132  133  135   Potassium 3.5 - 5.1 mmol/L 3.7  3.6  4.0   Chloride 98 - 111 mmol/L 98  95  101   CO2 22 - 32 mmol/L '25  27  21   '$ Calcium 8.9 - 10.3 mg/dL 8.1  8.4  9.4   Total Protein 6.5 - 8.1 g/dL 6.7     Total Bilirubin 0.3 - 1.2 mg/dL 0.8     Alkaline Phos 38 - 126 U/L 50     AST 15 - 41 U/L 13     ALT 0 - 44 U/L 16          Micro Results No results found for this or any previous visit (from the past 240 hour(s)).  Radiology Reports CT Abdomen Pelvis W Contrast  Result Date: 09/07/2022 CLINICAL DATA:  Left lower quadrant abdominal pain. EXAM: CT ABDOMEN AND PELVIS WITH CONTRAST TECHNIQUE: Multidetector CT imaging of the abdomen and pelvis was performed using the standard protocol following bolus administration of intravenous contrast. RADIATION DOSE REDUCTION: This exam was performed according to the departmental dose-optimization program which includes automated exposure control, adjustment of the mA and/or kV according to patient size and/or use of iterative reconstruction technique. CONTRAST:  160m OMNIPAQUE IOHEXOL 300 MG/ML  SOLN COMPARISON:  January 07, 2006 FINDINGS: Lower chest: No acute abnormality. Hepatobiliary: No focal liver abnormality is seen. No  gallstones, gallbladder wall thickening, or biliary dilatation. Pancreas: Unremarkable. No pancreatic ductal dilatation or surrounding inflammatory changes. Spleen: Normal in size without focal abnormality. Adrenals/Urinary Tract: Adrenal glands are unremarkable. Kidneys are normal, without renal calculi, focal lesion, or hydronephrosis. The urinary bladder is moderately distended with mild posterior urinary bladder wall thickening seen. Stomach/Bowel: Stomach is within normal limits. Appendix appears normal. No evidence of bowel dilatation. Numerous diverticula are seen throughout a markedly thickened and inflamed sigmoid colon, with a 6.3 cm x 7.1 cm x 5.5 cm pericolonic abscess seen along the mid to distal sigmoid colon. Vascular/Lymphatic: Aortic atherosclerosis. Calcified right inguinal lymph nodes are seen. Reproductive: Prostate gland is mildly enlarged. Other: No abdominal wall hernia or abnormality. No abdominopelvic ascites. Musculoskeletal: No acute or significant osseous findings. IMPRESSION: 1. Marked severity sigmoid diverticulitis with a 6.3 cm x 7.1 cm x 5.5 cm pericolonic abscess. 2. Mild focal cystitis involving the adjacent portion of the urinary bladder cannot be excluded. Correlation with urinalysis is recommended. 3. Prostatomegaly. 4. Aortic atherosclerosis. Aortic Atherosclerosis (ICD10-I70.0). Electronically Signed   By: TVirgina NorfolkM.D.   On: 09/07/2022 22:16    SIGNED: SDeatra James MD, FHM. FAAFP. MZacarias Pontes- [facility] Time spent > 35 min.  In seeing, evaluating and examining the patient. Reviewing medical records, labs, drawn plan of care. [facility],  Pager (please use [email or web address] to page/ text) Please use Epic Secure Chat for non-urgent communication (7AM-7PM)  If 7PM-7AM, please contact night-coverage [email or web address], 09/08/2022,  9:52 AM

## 2022-09-08 NOTE — Sedation Documentation (Signed)
Patient resting comfortably at this time.

## 2022-09-08 NOTE — H&P (Signed)
History and Physical    Patient: James Blake [id number] DOB: 12-17-1960 DOA: 09/07/2022 DOS: the patient was seen and examined on 09/08/2022 PCP: Cory Munch, PA-C  Patient coming from: Home  Chief Complaint:  Chief Complaint  Patient presents with   Abdominal Pain   HPI: James Blake is a 62 y.o. male with medical history significant of tobacco use disorder and no other known medical conditions, presents the ED with a chief complaint of abdominal pain.  Patient reports it all started a few weeks ago with constipation.  He took some stool softeners and was able to resolve his constipation.  A couple weeks ago he started having pressure in his suprapubic region whenever he would try to void.  Over the weekend that became acutely worse.  Patient reports that got progressively worse through the weekend.  He also started having fever and chills.  Patient did not have a measured temperature.  He went to urgent care and they saw blood in his urine so they sent him to the ER to rule out stone.  In the ED a CT of the abdomen was done that identified diverticulitis with a large pericolonic abscess.  Patient reports that the day prior to presentation his pain was so bad that he could not get comfortable and could not sleep.  In the days prior to that he been able to ride around until he could find a position that was less painful.  For the weekend patient did take some Tylenol for the pain which seemed to help at first, but then was no longer helping after a couple of doses.  He also was not eating and did not want to keep continue to take medication on empty stomach.  Patient reports his last normal meal was on Sunday.  He has had no appetite due to the pain.  Has had no nausea or vomiting.  His last normal bowel movement was on Monday.  He still felt like he had to evacuate his colon so he took some stool softeners with only liquid past.  He had cold chills through the weekend as mentioned  before.  He has not noticed any melena or hematochezia.  He noticed excessive flatulence.  He is very specific about his pain when he tries to void.  It feels like a pressure on his bladder and not like a burning sensation in his penis.  He reports it feels like something is pushing down on his bladder.  This makes sense because the abscess does back up to his bladder.  Patient has no other complaints at this time.  Patient does smoke about 2 packs/day.  He reports he knows that he has to quit but has not done it yet.  He reports he used to drink very heavily but quit 7 years ago.  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  His last dose was last week.  He has not been vaccinated for COVID or flu.  Patient is full code. Review of Systems: As mentioned in the history of present illness. All other systems reviewed and are negative. Past Medical History:  Diagnosis Date   Substance abuse [facility])    Past Surgical History:  Procedure Laterality Date   DENTAL SURGERY     HERNIA REPAIR     Social History:  reports that he has been smoking cigarettes. He has a 80.00 pack-year smoking history. He has never used smokeless  tobacco. He reports current alcohol use. He reports current drug use. Frequency: 7.00 times per week. Drug: Cocaine.  No Known Allergies  Family History  Problem Relation Age of Onset   Diabetes Father    Heart failure Father     Prior to Admission medications   Medication Sig Start Date End Date Taking? Authorizing Provider  acetaminophen (TYLENOL) 325 MG tablet Take 650 mg by mouth every 6 (six) hours as needed for moderate pain.   Yes [provider]  Aspirin-Acetaminophen (GOODYS BODY PAIN PO) Take 1 packet by mouth as needed (pain).   Yes [provider]  ibuprofen (ADVIL) 200 MG tablet Take 400 mg by mouth every 8 (eight) hours as needed for moderate pain.   Yes [provider]    Physical Exam: Vitals:    09/08/22 0247 09/08/22 0248 09/08/22 0330 09/08/22 0400  BP:  119/71 117/79 129/78  Pulse:  61 (!) 58 65  Resp:  16    Temp: 98.3 F (36.8 C) 98.3 F (36.8 C)    TempSrc: Oral Oral    SpO2:  97% 96% 100%  Weight:      Height:       1.  General: Patient lying supine in bed,  no acute distress   2. Psychiatric: Alert and oriented x 3, mood and behavior normal for situation, pleasant and cooperative with exam   3. Neurologic: Speech and language are normal, face is symmetric, moves all 4 extremities voluntarily, at baseline without acute deficits on limited exam   4. HEENMT:  Head is atraumatic, normocephalic, pupils reactive to light, neck is supple, trachea is midline, mucous membranes are moist   5. Respiratory : Lungs are clear to auscultation bilaterally without wheezing, rhonchi, rales, no cyanosis, no increase in work of breathing or accessory muscle use   6. Cardiovascular : Heart rate normal, rhythm is regular, no murmurs, rubs or gallops, no peripheral edema, peripheral pulses palpated   7. Gastrointestinal:  Abdomen is soft, nondistended, abdomen is tender in both lower quadrants without guarding, bowel sounds active, no masses or organomegaly palpated   8. Skin:  Skin is warm, dry and intact without rashes, acute lesions, or ulcers on limited exam   9.Musculoskeletal:  No acute deformities or trauma, no asymmetry in tone, no peripheral edema, peripheral pulses palpated, no tenderness to palpation in the extremities  Data Reviewed: In the ED Temp 99.2, heart rate 65-89, respiratory rate 18, blood pressure 102/66-138/79 Leukocytosis at 15.4, hemoglobin 12.7, platelets 300 Chemistries unremarkable CT abdomen pelvis shows diverticulitis with a 6 by 3 x 7 x 5.5 cm pericolonic abscess and mild focal cystitis Patient was started on Zosyn, morphine, and given a 1.5 L saline bolus General surgery was consulted and recommended patient to be admitted to [facility] for IR  to be consulted in the a.m. Admission requested for diverticulitis with abscess Assessment and Plan: * Colonic diverticular abscess - Abdominal pain, fever/chills, leukocytosis - CT abdomen pelvis shows diverticulitis with 6 by 3 x 7 x 5.5 cm pericolonic abscess with mild focal cystitis - Started on Zosyn in the ED - Continue Zosyn - Trend white blood cell count - General surgery consulted and reported the patient should be admitted here at [facility], but that IR should be consulted in the a.m. - IR consult ordered - Continue IV fluids - N.p.o. - Pain control with pain scale - Continue to monitor  Tobacco use disorder - Smokes 2 packs/day - Starting with one  21 mg nicotine patch, but may need another      Advance Care Planning:   Code Status: Full Code  Consults: GEN surge, IR  Family Communication: No family at bedside  Severity of Illness: The appropriate patient status for this patient is INPATIENT. Inpatient status is judged to be reasonable and necessary in order to provide the required intensity of service to ensure the patient's safety. The patient's presenting symptoms, physical exam findings, and initial radiographic and laboratory data in the context of their chronic comorbidities is felt to place them at high risk for further clinical deterioration. Furthermore, it is not anticipated that the patient will be medically stable for discharge from the hospital within 2 midnights of admission.   * I certify that at the point of admission it is my clinical judgment that the patient will require inpatient hospital care spanning beyond 2 midnights from the point of admission due to high intensity of service, high risk for further deterioration and high frequency of surveillance required.*  Author: Rolla Plate, DO 09/08/2022 4:19 AM  For on call review [email or web address].

## 2022-09-08 NOTE — TOC Progression Note (Addendum)
  Transition of Care [facility]) Screening Note   Patient Details  Name: James Blake Date of Birth: Nov 23, 1960   Transition of Care [facility]) CM/SW Contact:    Boneta Lucks, RN Phone Number: 09/08/2022, 10:48 AM  Transfer to [facility] for surgery.  Transition of Care Department [facility]) has reviewed patient and no TOC needs have been identified at this time. We will continue to monitor patient advancement through interdisciplinary progression rounds. If new patient transition needs arise, please place a TOC consult.       Barriers to Discharge: Continued Medical Work up  Expected Discharge Plan and Services     Social Determinants of Health (SDOH) Interventions SDOH Screenings   Food Insecurity: No Food Insecurity (09/08/2022)  Housing: Low Risk  (09/08/2022)  Transportation Needs: No Transportation Needs (09/08/2022)  Utilities: Not At Risk (09/08/2022)  Tobacco Use: High Risk (09/08/2022)

## 2022-09-08 NOTE — Consult Note (Signed)
Chief Complaint: Patient was seen in consultation today for pelvic abscess drain placement Chief Complaint  Patient presents with   Abdominal Pain   at the request of Dr Clearence Ped; Dr Aaron Mose General Surgery   Supervising Physician: Ruthann Cancer  Patient Status: [facility] IP  History of Present Illness: James Blake is a 62 y.o. male   Developed abd pain and constipation To ED CT yesterday: IMPRESSION: 1. Marked severity sigmoid diverticulitis with a 6.3 cm x 7.1 cm x 5.5 cm pericolonic abscess. 2. Mild focal cystitis involving the adjacent portion of the urinary bladder cannot be excluded. Correlation with urinalysis is recommended. 3. Prostatomegaly. 4. Aortic atherosclerosis.  General surgery consulted per [facility] note Request abscess drain in IR  Imaging has been reviewed with Dr Serafina Royals Planned for procedure today   Past Medical History:  Diagnosis Date   Substance abuse [facility])     Past Surgical History:  Procedure Laterality Date   DENTAL SURGERY     HERNIA REPAIR      Allergies: Patient has no known allergies.  Medications: Prior to Admission medications   Medication Sig Start Date End Date Taking? Authorizing Provider  acetaminophen (TYLENOL) 325 MG tablet Take 650 mg by mouth every 6 (six) hours as needed for moderate pain.   Yes [provider]  Aspirin-Acetaminophen (GOODYS BODY PAIN PO) Take 1 packet by mouth as needed (pain).   Yes [provider]  ibuprofen (ADVIL) 200 MG tablet Take 400 mg by mouth every 8 (eight) hours as needed for moderate pain.   Yes [provider]     Family History  Problem Relation Age of Onset   Diabetes Father    Heart failure Father     Social History   Socioeconomic History   Marital status: Divorced    Spouse name: Not on file   Number of children: Not on file   Years of education: Not on file   Highest education level: Not on file  Occupational History   Not on file   Tobacco Use   Smoking status: Every Day    Packs/day: 2.00    Years: 40.00    Total pack years: 80.00    Types: Cigarettes   Smokeless tobacco: Never  Substance and Sexual Activity   Alcohol use: Not Currently    Comment: rarely drinks etoh    Drug use: Yes    Frequency: 7.0 times per week    Types: Cocaine    Comment: smokes crack every day    Sexual activity: Not on file  Other Topics Concern   Not on file  Social History Narrative   Not on file   Social Determinants of Health   Financial Resource Strain: Not on file  Food Insecurity: No Food Insecurity (09/08/2022)   Hunger Vital Sign    Worried About Running Out of Food in the Last Year: Never true    Ran Out of Food in the Last Year: Never true  Transportation Needs: No Transportation Needs (09/08/2022)   PRAPARE - Hydrologist (Medical): No    Lack of Transportation (Non-Medical): No  Physical Activity: Not on file  Stress: Not on file  Social Connections: Not on file    Review of Systems: A 12 point ROS discussed and pertinent positives are indicated in the HPI above.  All other systems are negative.  Review of Systems  Constitutional:  Positive for activity change. Negative for fever.  Gastrointestinal:  Positive  for abdominal pain, constipation, diarrhea and rectal pain.  Psychiatric/Behavioral:  Negative for behavioral problems and confusion.     Vital Signs: BP 101/71 (BP Location: Left Arm)   Pulse 64   Temp 98.5 F (36.9 C) (Oral)   Resp 14   Ht '5\' 11"'$  (1.803 m)   Wt 158 lb 4.6 oz (71.8 kg)   SpO2 97%   BMI 22.08 kg/m     Physical Exam Vitals reviewed.  HENT:     Mouth/Throat:     Mouth: Mucous membranes are moist.  Cardiovascular:     Rate and Rhythm: Normal rate and regular rhythm.  Pulmonary:     Effort: Pulmonary effort is normal.     Breath sounds: Normal breath sounds.  Abdominal:     Tenderness: There is abdominal tenderness.  Skin:    General: Skin  is warm.  Neurological:     Mental Status: He is alert and oriented to person, place, and time.  Psychiatric:        Behavior: Behavior normal.     Imaging: CT Abdomen Pelvis W Contrast  Result Date: 09/07/2022 CLINICAL DATA:  Left lower quadrant abdominal pain. EXAM: CT ABDOMEN AND PELVIS WITH CONTRAST TECHNIQUE: Multidetector CT imaging of the abdomen and pelvis was performed using the standard protocol following bolus administration of intravenous contrast. RADIATION DOSE REDUCTION: This exam was performed according to the departmental dose-optimization program which includes automated exposure control, adjustment of the mA and/or kV according to patient size and/or use of iterative reconstruction technique. CONTRAST:  167m OMNIPAQUE IOHEXOL 300 MG/ML  SOLN COMPARISON:  January 07, 2006 FINDINGS: Lower chest: No acute abnormality. Hepatobiliary: No focal liver abnormality is seen. No gallstones, gallbladder wall thickening, or biliary dilatation. Pancreas: Unremarkable. No pancreatic ductal dilatation or surrounding inflammatory changes. Spleen: Normal in size without focal abnormality. Adrenals/Urinary Tract: Adrenal glands are unremarkable. Kidneys are normal, without renal calculi, focal lesion, or hydronephrosis. The urinary bladder is moderately distended with mild posterior urinary bladder wall thickening seen. Stomach/Bowel: Stomach is within normal limits. Appendix appears normal. No evidence of bowel dilatation. Numerous diverticula are seen throughout a markedly thickened and inflamed sigmoid colon, with a 6.3 cm x 7.1 cm x 5.5 cm pericolonic abscess seen along the mid to distal sigmoid colon. Vascular/Lymphatic: Aortic atherosclerosis. Calcified right inguinal lymph nodes are seen. Reproductive: Prostate gland is mildly enlarged. Other: No abdominal wall hernia or abnormality. No abdominopelvic ascites. Musculoskeletal: No acute or significant osseous findings. IMPRESSION: 1. Marked severity  sigmoid diverticulitis with a 6.3 cm x 7.1 cm x 5.5 cm pericolonic abscess. 2. Mild focal cystitis involving the adjacent portion of the urinary bladder cannot be excluded. Correlation with urinalysis is recommended. 3. Prostatomegaly. 4. Aortic atherosclerosis. Aortic Atherosclerosis (ICD10-I70.0). Electronically Signed   By: TVirgina NorfolkM.D.   On: 09/07/2022 22:16    Labs:  CBC: Recent Labs    09/07/22 2033 09/08/22 0400  WBC 15.4* 14.7*  HGB 12.7* 11.9*  HCT 38.3* 36.5*  PLT 300 266    COAGS: No results for input(s): "INR", "APTT" in the last 8760 hours.  BMP: Recent Labs    09/07/22 2033 09/08/22 0400  NA 133* 132*  K 3.6 3.7  CL 95* 98  CO2 27 25  GLUCOSE 113* 115*  BUN 11 12  CALCIUM 8.4* 8.1*  CREATININE 1.07 0.98  GFRNONAA >60 >60    LIVER FUNCTION TESTS: Recent Labs    09/08/22 0400  BILITOT 0.8  AST 13*  ALT 16  ALKPHOS 50  PROT 6.7  ALBUMIN 3.0*    TUMOR MARKERS: No results for input(s): "AFPTM", "CEA", "CA199", "CHROMGRNA" in the last 8760 hours.  Assessment and Plan:  Pelvic abscess Scheduled for abscess drain placement in IR [facility] today Pt to go to [facility] via ambulance Will return to [facility] after procedure Risks and benefits discussed with the patient including bleeding, infection, damage to adjacent structures, bowel perforation/fistula connection, and sepsis.  All of the patient's questions were answered, patient is agreeable to proceed. Consent signed and in chart.  Thank you for this interesting consult.  I greatly enjoyed meeting GERHARDT HUAMAN and look forward to participating in their care.  A copy of this report was sent to the requesting provider on this date.  Electronically Signed: Lavonia Drafts, PA-C 09/08/2022, 8:25 AM   I spent a total of 20 Minutes    in face to face in clinical consultation, greater than 50% of which was counseling/coordinating care for pelvic abscess drain

## 2022-09-08 NOTE — Assessment & Plan Note (Addendum)
-  Spiked a temperature of 102.7 overnight -This morning hemodynamically stable with temp of 98.2 BP 112/67 -WBC 14.7 >> 12.4  - Abdominal pain improved with IV pian meds  - improved - fever/chills, leukocytosis - CT abdomen pelvis shows diverticulitis with 6 by 3 x 7 x 5.5 cm pericolonic abscess with mild focal cystitis  -09/08/2022-status post left flank abdominal drain placement to the abscess Draining well   - Continue Zosyn - Trend white blood cell count -  General surgery consulted recommended discharge with p.o. antibiotics once stable follow-up as an outpatient   - Pain control with pain scale - Continue to monitor

## 2022-09-08 NOTE — Progress Notes (Addendum)
[facility]  Patient is a 62 year old male who was admitted with diverticulitis with pericolonic abscess.  Attempted to evaluate patient, however he is down to [facility] for IR guided drain placement.  Will plan to see the patient once he returns to [facility].  Tentative plan: -Okay for clear liquids status post procedure -If patient tolerates clears, okay advance to full liquids this evening -Continue Zosyn -PRN pain control and antiemetics -Repeat blood work tomorrow AM -No plans for surgical intervention at this time -Full consult note to follow after patient has been evaluated  Graciella Freer, DO [facility] [address] [place], [place] [address] [phone] (office)

## 2022-09-08 NOTE — Progress Notes (Signed)
   09/08/22 1950  Vitals  Temp (!) 102.7 F (39.3 C)  Temp Source Oral  BP 109/67  MAP (mmHg) 79  BP Location Left Arm  BP Method Automatic  Patient Position (if appropriate) Lying  Pulse Rate 80  Pulse Rate Source Monitor  Resp 18  MEWS COLOR  MEWS Score Color Yellow  Oxygen Therapy  SpO2 96 %  O2 Device Room Air  Pain Assessment  Pain Scale 0-10  Pain Score 6  Pain Type Acute pain  Pain Location Abdomen  Pain Intervention(s) Medication (See eMAR)  MEWS Score  MEWS Temp 2  MEWS Systolic 0  MEWS Pulse 0  MEWS RR 0  MEWS LOC 0  MEWS Score 2  Provider Notification  Provider Name/Title Dr Lucy Antigua  Date Provider Notified 09/08/22  Time Provider Notified 2000  Method of Notification Page  Notification Reason Change in status  Provider response No new orders  Date of Provider Response 09/08/22  Time of Provider Response 2016

## 2022-09-08 NOTE — Hospital Course (Addendum)
James Blake is a 62 y.o. male with medical history significant of tobacco use disorder and no other known medical conditions, presents the ED with a chief complaint of abdominal pain.  Patient reports it all started a few weeks ago with constipation.    Patient reports that got progressively worse through the weekend.  He also started having fever and chills.   He went to urgent care and they saw blood in his urine so they sent him to the ER to rule out stone.  In the ED a CT of the abdomen was done that identified diverticulitis with a large pericolonic abscess.    He has not noticed any melena or hematochezia.  He noticed excessive flatulence.  He is very specific about his pain when he tries to void.  It feels like a pressure on his bladder and not like a burning sensation in his penis.  He reports it feels like something is pushing down on his bladder.    Patient does smoke about 2 packs/day.  He reports he knows that he has to quit but has not done it yet.  He reports he used to drink very heavily but quit 7 years ago.  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  His last dose was last week.  He has not been vaccinated for COVID or flu.  Patient is full code.  ED: Temp 99.2, heart rate 65-89, respiratory rate 18, blood pressure 102/66-138/79 Leukocytosis at 15.4, hemoglobin 12.7, platelets 300 Chemistries unremarkable CT abdomen pelvis shows diverticulitis with a 6 by 3 x 7 x 5.5 cm pericolonic abscess and mild focal cystitis Patient was started on Zosyn, morphine, and given a 1.5 L saline bolus General surgery was consulted and recommended patient to be admitted to [facility] for IR to be consulted in the a.m. Admission requested for diverticulitis with abscess

## 2022-09-09 DIAGNOSIS — R509 Fever, unspecified: Secondary | ICD-10-CM | POA: Diagnosis present

## 2022-09-09 DIAGNOSIS — K5720 Diverticulitis of large intestine with perforation and abscess without bleeding: Secondary | ICD-10-CM | POA: Diagnosis not present

## 2022-09-09 LAB — CBC
HCT: 34.4 % — ABNORMAL LOW (ref 39.0–52.0)
Hemoglobin: 10.9 g/dL — ABNORMAL LOW (ref 13.0–17.0)
MCH: 28.8 pg (ref 26.0–34.0)
MCHC: 31.7 g/dL (ref 30.0–36.0)
MCV: 91 fL (ref 80.0–100.0)
Platelets: 261 10*3/uL (ref 150–400)
RBC: 3.78 MIL/uL — ABNORMAL LOW (ref 4.22–5.81)
RDW: 12.8 % (ref 11.5–15.5)
WBC: 12.4 10*3/uL — ABNORMAL HIGH (ref 4.0–10.5)
nRBC: 0 % (ref 0.0–0.2)

## 2022-09-09 MED ORDER — SODIUM CHLORIDE 0.9% FLUSH
5.0000 mL | Freq: Three times a day (TID) | INTRAVENOUS | Status: DC
  Administered 2022-09-09 – 2022-09-10 (×3): 5 mL

## 2022-09-09 MED ORDER — SENNOSIDES-DOCUSATE SODIUM 8.6-50 MG PO TABS
2.0000 | ORAL_TABLET | Freq: Two times a day (BID) | ORAL | Status: DC
  Administered 2022-09-09 – 2022-09-10 (×3): 2 via ORAL
  Filled 2022-09-09 (×4): qty 2

## 2022-09-09 NOTE — Progress Notes (Signed)
Interventional Radiology Brief Note:  PA called to [facility] 300 unit to assess for drain placement.  Patient has not had any output since this AM.  Pain well controlled with PO meds.  Mobile. Spiked a fever overnight, but none since.  Requested RN flush drainage catheter to ensure it is working properly and confirmed drain care orders have been placed as well as d/c orders.   RN given call back number if concerns present.  Brynda Greathouse, MS RD PA-C

## 2022-09-09 NOTE — Assessment & Plan Note (Signed)
-   Likely due to diverticular abscess -Ruling out bacteremia, blood cultures obtained -Continuing IV antibiotics Zosyn -If remain afebrile next 24 hours will be discharged home

## 2022-09-09 NOTE — Progress Notes (Signed)
Patient out of bed walking around the unit. IV pulled out for second time today.

## 2022-09-09 NOTE — Plan of Care (Signed)
  Problem: Nutrition: Goal: Adequate nutrition will be maintained Outcome: Progressing   

## 2022-09-09 NOTE — TOC Transition Note (Signed)
Transition of Care [facility]) - CM/SW Discharge Note   Patient Details  Name: James Blake MRN: [id number] Date of Birth: 03-24-1961  Transition of Care [facility]) CM/SW Contact:  Boneta Lucks, RN Phone Number: 09/09/2022, 10:21 AM   Clinical Narrative:   Insurance will not cover nursing at home. RN will educate before discharge.    Final next level of care: Home/Self Care Barriers to Discharge: No [place] will accept this patient   Patient Goals and CMS Choice CMS Medicare.gov Compare Post Acute Care list provided to:: Patient   Discharge Placement      Patient and family notified of of transfer: 09/09/22  Discharge Plan and Services Additional resources added to the After Visit Summary for        Social Determinants of Health (SDOH) Interventions SDOH Screenings   Food Insecurity: No Food Insecurity (09/08/2022)  Housing: Low Risk  (09/08/2022)  Transportation Needs: No Transportation Needs (09/08/2022)  Utilities: Not At Risk (09/08/2022)  Tobacco Use: High Risk (09/08/2022)    Readmission Risk Interventions    09/09/2022   10:18 AM  Readmission Risk Prevention Plan  Post Dischage Appt Not Complete  Medication Screening Complete  Transportation Screening Complete

## 2022-09-09 NOTE — Progress Notes (Signed)
[facility] Progress Note   Subjective: James Blake is a 62 year old male who was admitted with diverticulitis with pericolonic abscess.  His last bowel movement was a couple of days ago, and he last passed flatus this morning with subsequent relief.  He underwent IR drain placement yesterday and has some soreness in that area but is otherwise not in pain currently. Urinary stream has improved and feels like he can empty his bladder. Continues to ambulate. Overnight events include a fever with Tmax 102.7. He is feeling significantly better and endorses hunger.   Objective: Vital signs in last 24 hours: Temp:  [98 F (36.7 C)-102.7 F (39.3 C)] 98.2 F (36.8 C) (03/01 0720) Pulse Rate:  [61-80] 68 (03/01 0720) Resp:  [12-19] 18 (03/01 0720) BP: (96-129)/(59-83) 112/67 (03/01 0720) SpO2:  [94 %-100 %] 96 % (03/01 0720) Last BM Date : 09/05/22  Intake/Output from previous day: 02/29 0701 - 03/01 0700 In: 2544.1 [P.O.:680; I.V.:1688.7; IV Piggyback:175.4] Out: 130 [Drains:130] Intake/Output this shift: No intake/output data recorded.  Physical Exam Pulmonary:     Effort: Pulmonary effort is normal.  Abdominal:     General: Abdomen is flat. A surgical scar is present.     Palpations: Abdomen is soft.     Tenderness: There is abdominal tenderness in the suprapubic area.     Hernia: A hernia is present. Hernia is present in the left inguinal area.  Skin:    General: Skin is warm and dry.  Neurological:     Mental Status: He is alert.  Psychiatric:        Mood and Affect: Mood normal.     Lab Results:  Recent Labs    09/08/22 0400 09/09/22 0455  WBC 14.7* 12.4*  HGB 11.9* 10.9*  HCT 36.5* 34.4*  PLT 266 261   BMET Recent Labs    09/07/22 2033 09/08/22 0400  NA 133* 132*  K 3.6 3.7  CL 95* 98  CO2 27 25  GLUCOSE 113* 115*  BUN 11 12  CREATININE 1.07 0.98  CALCIUM 8.4* 8.1*   PT/INR Recent Labs    09/08/22 0804  LABPROT 13.9  INR 1.1     Studies/Results: CT GUIDED PERITONEAL/RETROPERITONEAL FLUID DRAIN BY PERC CATH  Result Date: 09/08/2022 INDICATION: 62 year old male with history of diverticular abscess. EXAM: CT PERC DRAIN PERITONEAL ABCESS COMPARISON:  CT abdomen pelvis from 09/07/2022 MEDICATIONS: The patient is currently admitted to the hospital and receiving intravenous antibiotics. The antibiotics were administered within an appropriate time frame prior to the initiation of the procedure. ANESTHESIA/SEDATION: Moderate (conscious) sedation was employed during this procedure. A total of Versed 2 mg and Fentanyl 125 mcg was administered intravenously. Moderate Sedation Time: 13 minutes. The patient's level of consciousness and vital signs were monitored continuously by radiology nursing throughout the procedure under my direct supervision. CONTRAST:  None COMPLICATIONS: None immediate. PROCEDURE: RADIATION DOSE REDUCTION: This exam was performed according to the departmental dose-optimization program which includes automated exposure control, adjustment of the mA and/or kV according to patient size and/or use of iterative reconstruction technique. Informed written consent was obtained from the patient after a discussion of the risks, benefits and alternatives to treatment. The patient was placed supine on the CT gantry and a pre procedural CT was performed re-demonstrating the known abscess/fluid collection within the left hemipelvis. The procedure was planned. A timeout was performed prior to the initiation of the procedure. The left lower quadrant was prepped and draped in the usual sterile fashion. The  overlying soft tissues were anesthetized with 1% lidocaine with epinephrine. Appropriate trajectory was planned with the use of a 22 gauge spinal needle. An 18 gauge trocar needle was advanced into the abscess/fluid collection and a short Amplatz super stiff wire was coiled within the collection. Appropriate positioning was confirmed  with a limited CT scan. The tract was serially dilated allowing placement of a 10 [place] all-purpose drainage catheter. Appropriate positioning was confirmed with a limited postprocedural CT scan. Approximately 10 ml of purulent fluid was aspirated. The tube was connected to a bulb suction and sutured in place. A dressing was placed. The patient tolerated the procedure well without immediate post procedural complication. IMPRESSION: Successful CT guided placement of a 10 French all purpose drain catheter into the left pelvic diverticular abscess with aspiration of approximately 10 mL of purulent fluid. Samples were sent to the laboratory as requested by the ordering clinical team. Ruthann Cancer, MD [facility] Radiology Electronically Signed   By: Ruthann Cancer M.D.   On: 09/08/2022 13:00   CT Abdomen Pelvis W Contrast  Result Date: 09/07/2022 CLINICAL DATA:  Left lower quadrant abdominal pain. EXAM: CT ABDOMEN AND PELVIS WITH CONTRAST TECHNIQUE: Multidetector CT imaging of the abdomen and pelvis was performed using the standard protocol following bolus administration of intravenous contrast. RADIATION DOSE REDUCTION: This exam was performed according to the departmental dose-optimization program which includes automated exposure control, adjustment of the mA and/or kV according to patient size and/or use of iterative reconstruction technique. CONTRAST:  155m OMNIPAQUE IOHEXOL 300 MG/ML  SOLN COMPARISON:  January 07, 2006 FINDINGS: Lower chest: No acute abnormality. Hepatobiliary: No focal liver abnormality is seen. No gallstones, gallbladder wall thickening, or biliary dilatation. Pancreas: Unremarkable. No pancreatic ductal dilatation or surrounding inflammatory changes. Spleen: Normal in size without focal abnormality. Adrenals/Urinary Tract: Adrenal glands are unremarkable. Kidneys are normal, without renal calculi, focal lesion, or hydronephrosis. The urinary  bladder is moderately distended with mild posterior urinary bladder wall thickening seen. Stomach/Bowel: Stomach is within normal limits. Appendix appears normal. No evidence of bowel dilatation. Numerous diverticula are seen throughout a markedly thickened and inflamed sigmoid colon, with a 6.3 cm x 7.1 cm x 5.5 cm pericolonic abscess seen along the mid to distal sigmoid colon. Vascular/Lymphatic: Aortic atherosclerosis. Calcified right inguinal lymph nodes are seen. Reproductive: Prostate gland is mildly enlarged. Other: No abdominal wall hernia or abnormality. No abdominopelvic ascites. Musculoskeletal: No acute or significant osseous findings. IMPRESSION: 1. Marked severity sigmoid diverticulitis with a 6.3 cm x 7.1 cm x 5.5 cm pericolonic abscess. 2. Mild focal cystitis involving the adjacent portion of the urinary bladder cannot be excluded. Correlation with urinalysis is recommended. 3. Prostatomegaly. 4. Aortic atherosclerosis. Aortic Atherosclerosis (ICD10-I70.0). Electronically Signed   By: TVirgina NorfolkM.D.   On: 09/07/2022 22:16    Anti-infectives: Anti-infectives (From admission, onward)    Start     Dose/Rate Route Frequency Ordered Stop   09/08/22 0600  piperacillin-tazobactam (ZOSYN) IVPB 3.375 g  Status:  Discontinued        3.375 g 100 mL/hr over 30 Minutes Intravenous Every 8 hours 09/08/22 0418 09/08/22 0420   09/08/22 0600  piperacillin-tazobactam (ZOSYN) IVPB 3.375 g        3.375 g 12.5 mL/hr over 240 Minutes Intravenous Every 8 hours 09/08/22 0420     09/07/22 2230  piperacillin-tazobactam (ZOSYN) IVPB 3.375 g        3.375 g 100 mL/hr over 30 Minutes Intravenous  Once 09/07/22  2220 09/07/22 2314       Assessment/Plan: Advance diet Continue Zosyn Plan for discharge tomorrow  LOS: 2 days   Glorious Peach, Medical Student  09/09/2022

## 2022-09-09 NOTE — Progress Notes (Signed)
PROGRESS NOTE    Patient: James Blake                            PCP: Cory Munch, PA-C                    DOB: 1960/12/13            DOA: 09/07/2022 [id number]             DOS: 09/09/2022, 11:04 AM   LOS: 2 days   Date of Service: The patient was seen and examined on 09/09/2022  Subjective:   The patient was seen and examined this morning, hemodynamically stable no acute distress Status post left flank pelvic drain placement by IR on 2/29 Spiked a fever overnight 102.7 >> currently afebrile temp 98.2 Otherwise hemodynamically stable   Brief Narrative:    James Blake is a 62 y.o. male with medical history significant of tobacco use disorder and no other known medical conditions, presents the ED with a chief complaint of abdominal pain.  Patient reports it all started a few weeks ago with constipation.    Patient reports that got progressively worse through the weekend.  He also started having fever and chills.   He went to urgent care and they saw blood in his urine so they sent him to the ER to rule out stone.  In the ED a CT of the abdomen was done that identified diverticulitis with a large pericolonic abscess.    He has not noticed any melena or hematochezia.  He noticed excessive flatulence.  He is very specific about his pain when he tries to void.  It feels like a pressure on his bladder and not like a burning sensation in his penis.  He reports it feels like something is pushing down on his bladder.    Patient does smoke about 2 packs/day.  He reports he knows that he has to quit but has not done it yet.  He reports he used to drink very heavily but quit 7 years ago.  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  His last dose was last week.  He has not been vaccinated for COVID or flu.  Patient is full code.  ED: Temp 99.2, heart rate 65-89, respiratory rate 18, blood pressure 102/66-138/79 Leukocytosis at 15.4, hemoglobin  12.7, platelets 300 Chemistries unremarkable CT abdomen pelvis shows diverticulitis with a 6 by 3 x 7 x 5.5 cm pericolonic abscess and mild focal cystitis Patient was started on Zosyn, morphine, and given a 1.5 L saline bolus General surgery was consulted and recommended patient to be admitted to [facility] for IR to be consulted in the a.m. Admission requested for diverticulitis with abscess    Assessment & Plan:   Principal Problem:   Colonic diverticular abscess Active Problems:   Fever   Tobacco use disorder     Assessment and Plan: * Colonic diverticular abscess -Spiked a temperature of 102.7 overnight -This morning hemodynamically stable with temp of 98.2 BP 112/67 -WBC 14.7 >> 12.4  - Abdominal pain improved with IV pian meds  - improved - fever/chills, leukocytosis - CT abdomen pelvis shows diverticulitis with 6 by 3 x 7 x 5.5 cm pericolonic abscess with mild focal cystitis  -09/08/2022-status post left flank abdominal drain placement to the abscess Draining well   - Continue Zosyn -  Trend white blood cell count -  General surgery consulted recommended discharge with p.o. antibiotics once stable follow-up as an outpatient   - Pain control with pain scale - Continue to monitor  Fever - Likely due to diverticular abscess -Ruling out bacteremia, blood cultures obtained -Continuing IV antibiotics Zosyn -If remain afebrile next 24 hours will be discharged home  Tobacco use disorder - Smokes 2 packs/day - Starting with one 21 mg nicotine patch, but may need another        ------------------------------------------------------------------------------------------------------------------------------------------------  DVT prophylaxis:  Place and maintain sequential compression device Start: 09/08/22 0726 SCDs Start: 09/08/22 0004   Code Status:   Code Status: Full Code  Family Communication: No family member present at bedside- attempt will be made to  update daily  -Advance care planning has been discussed.   Admission status:   Status is: Inpatient Remains inpatient appropriate because: needing IV Abx and surgical intervention    Disposition: From  - home             Planning for discharge in 1-2 days: to   Procedures:   No admission procedures for hospital encounter.   Antimicrobials:  Anti-infectives (From admission, onward)    Start     Dose/Rate Route Frequency Ordered Stop   09/08/22 0600  piperacillin-tazobactam (ZOSYN) IVPB 3.375 g  Status:  Discontinued        3.375 g 100 mL/hr over 30 Minutes Intravenous Every 8 hours 09/08/22 0418 09/08/22 0420   09/08/22 0600  piperacillin-tazobactam (ZOSYN) IVPB 3.375 g        3.375 g 12.5 mL/hr over 240 Minutes Intravenous Every 8 hours 09/08/22 0420     09/07/22 2230  piperacillin-tazobactam (ZOSYN) IVPB 3.375 g        3.375 g 100 mL/hr over 30 Minutes Intravenous  Once 09/07/22 2220 09/07/22 2314        Medication:   nicotine  21 mg Transdermal Daily    acetaminophen **OR** acetaminophen, morphine injection, ondansetron **OR** ondansetron (ZOFRAN) IV, oxyCODONE   Objective:   Vitals:   09/08/22 2354 09/09/22 0450 09/09/22 0719 09/09/22 0720  BP: 98/68 (!) 102/59 124/77 112/67  Pulse: 61 63 66 68  Resp: '14 18 12 18  '$ Temp: 98.9 F (37.2 C) 99.3 F (37.4 C) 98.7 F (37.1 C) 98.2 F (36.8 C)  TempSrc: Oral Oral Oral Oral  SpO2: 98% 100%  96%  Weight:      Height:        Intake/Output Summary (Last 24 hours) at 09/09/2022 1104 Last data filed at 09/09/2022 0603 Gross per 24 hour  Intake 2544.08 ml  Output 130 ml  Net 2414.08 ml   Filed Weights   09/07/22 2009 09/08/22 0554  Weight: 77.1 kg 71.8 kg     Physical examination:    General:  AAO x 3,  cooperative, no distress;   HEENT:  Normocephalic, PERRL, otherwise with in Normal limits   Neuro:  CNII-XII intact. , normal motor and sensation, reflexes intact   Lungs:   Clear to auscultation BL,  Respirations unlabored,  No wheezes / crackles  Cardio:    S1/S2, RRR, No murmure, No Rubs or Gallops   Abdomen:  Left flank pelvic drain in place secured to the side Soft, non-tender, bowel sounds active all four quadrants, no guarding or peritoneal signs.  Muscular  skeletal:  Limited exam -global generalized weaknesses - in bed, able to move all 4 extremities,   2+ pulses,  symmetric, No pitting edema  Skin:  Dry, warm to touch, negative for any Rashes,  Wounds: Please see nursing documentation         -------------------------------------------------------------------------------------------------------------------------    LABs:     Latest Ref Rng & Units 09/09/2022    4:55 AM 09/08/2022    4:00 AM 09/07/2022    8:33 PM  CBC  WBC 4.0 - 10.5 K/uL 12.4  14.7  15.4   Hemoglobin 13.0 - 17.0 g/dL 10.9  11.9  12.7   Hematocrit 39.0 - 52.0 % 34.4  36.5  38.3   Platelets 150 - 400 K/uL 261  266  300       Latest Ref Rng & Units 09/08/2022    4:00 AM 09/07/2022    8:33 PM 02/14/2020    6:18 PM  CMP  Glucose 70 - 99 mg/dL 115  113  101   BUN 8 - 23 mg/dL '12  11  9   '$ Creatinine 0.61 - 1.24 mg/dL 0.98  1.07  0.93   Sodium 135 - 145 mmol/L 132  133  135   Potassium 3.5 - 5.1 mmol/L 3.7  3.6  4.0   Chloride 98 - 111 mmol/L 98  95  101   CO2 22 - 32 mmol/L '25  27  21   '$ Calcium 8.9 - 10.3 mg/dL 8.1  8.4  9.4   Total Protein 6.5 - 8.1 g/dL 6.7     Total Bilirubin 0.3 - 1.2 mg/dL 0.8     Alkaline Phos 38 - 126 U/L 50     AST 15 - 41 U/L 13     ALT 0 - 44 U/L 16          Micro Results Recent Results (from the past 240 hour(s))  Urine Culture (for pregnant, neutropenic or urologic patients or patients with an indwelling urinary catheter)     Status: None (Preliminary result)   Collection Time: 09/07/22 11:38 PM   Specimen: Urine, Clean Catch  Result Value Ref Range Status   Specimen Description   Final    URINE, CLEAN CATCH Performed at [facility], [address],  [place], [place] [address]    Special Requests   Final    NONE Performed at [facility], [address] [place], [place] [address]    Culture   Final    NO GROWTH 1 DAY Performed at [facility], [place] [address] [place], [place] [address]    Report Status PENDING  Incomplete  Aerobic/Anaerobic Culture w Gram Stain (surgical/deep wound)     Status: None (Preliminary result)   Collection Time: 09/08/22 11:12 AM   Specimen: Abscess  Result Value Ref Range Status   Specimen Description ABSCESS  Final   Special Requests NONE  Final   Gram Stain   Final    ABUNDANT WBC PRESENT, PREDOMINANTLY PMN ABUNDANT GRAM POSITIVE COCCI FEW GRAM POSITIVE COCCI IN CHAINS MODERATE GRAM NEGATIVE RODS    Culture   Final    ABUNDANT GRAM NEGATIVE RODS ABUNDANT STREPTOCOCCUS CONSTELLATUS CULTURE REINCUBATED FOR BETTER GROWTH Performed at [facility], [place] [address], [place], [place] [address]    Report Status PENDING  Incomplete    Radiology Reports CT GUIDED PERITONEAL/RETROPERITONEAL FLUID DRAIN BY PERC CATH  Result Date: 09/08/2022 INDICATION: 62 year old male with history of diverticular abscess. EXAM: CT PERC DRAIN PERITONEAL ABCESS COMPARISON:  CT abdomen pelvis from 09/07/2022 MEDICATIONS: The patient is currently admitted to the hospital and receiving intravenous antibiotics. The antibiotics were administered  within an appropriate time frame prior to the initiation of the procedure. ANESTHESIA/SEDATION: Moderate (conscious) sedation was employed during this procedure. A total of Versed 2 mg and Fentanyl 125 mcg was administered intravenously. Moderate Sedation Time: 13 minutes. The patient's level of consciousness and vital signs were monitored continuously by radiology nursing throughout the procedure under my direct supervision. CONTRAST:  None COMPLICATIONS: None immediate. PROCEDURE: RADIATION DOSE REDUCTION: This exam was performed according to the departmental dose-optimization  program which includes automated exposure control, adjustment of the mA and/or kV according to patient size and/or use of iterative reconstruction technique. Informed written consent was obtained from the patient after a discussion of the risks, benefits and alternatives to treatment. The patient was placed supine on the CT gantry and a pre procedural CT was performed re-demonstrating the known abscess/fluid collection within the left hemipelvis. The procedure was planned. A timeout was performed prior to the initiation of the procedure. The left lower quadrant was prepped and draped in the usual sterile fashion. The overlying soft tissues were anesthetized with 1% lidocaine with epinephrine. Appropriate trajectory was planned with the use of a 22 gauge spinal needle. An 18 gauge trocar needle was advanced into the abscess/fluid collection and a short Amplatz super stiff wire was coiled within the collection. Appropriate positioning was confirmed with a limited CT scan. The tract was serially dilated allowing placement of a 10 [place] all-purpose drainage catheter. Appropriate positioning was confirmed with a limited postprocedural CT scan. Approximately 10 ml of purulent fluid was aspirated. The tube was connected to a bulb suction and sutured in place. A dressing was placed. The patient tolerated the procedure well without immediate post procedural complication. IMPRESSION: Successful CT guided placement of a 10 French all purpose drain catheter into the left pelvic diverticular abscess with aspiration of approximately 10 mL of purulent fluid. Samples were sent to the laboratory as requested by the ordering clinical team. Ruthann Cancer, MD [facility] Radiology Electronically Signed   By: Ruthann Cancer M.D.   On: 09/08/2022 13:00    SIGNED: [name], MD, FHM. FAAFP. [facility] - [facility] Time spent > 35 min.  In seeing, evaluating and examining  the patient. Reviewing medical records, labs, drawn plan of care. [facility],  Pager (please use [email or web address] to page/ text) Please use Epic Secure Chat for non-urgent communication (7AM-7PM)  If 7PM-7AM, please contact night-coverage [email or web address], 09/09/2022, 11:04 AM

## 2022-09-10 DIAGNOSIS — K5720 Diverticulitis of large intestine with perforation and abscess without bleeding: Secondary | ICD-10-CM | POA: Diagnosis not present

## 2022-09-10 LAB — CBC
HCT: 32.9 % — ABNORMAL LOW (ref 39.0–52.0)
Hemoglobin: 10.6 g/dL — ABNORMAL LOW (ref 13.0–17.0)
MCH: 28.9 pg (ref 26.0–34.0)
MCHC: 32.2 g/dL (ref 30.0–36.0)
MCV: 89.6 fL (ref 80.0–100.0)
Platelets: 250 10*3/uL (ref 150–400)
RBC: 3.67 MIL/uL — ABNORMAL LOW (ref 4.22–5.81)
RDW: 12.8 % (ref 11.5–15.5)
WBC: 8.4 10*3/uL (ref 4.0–10.5)
nRBC: 0 % (ref 0.0–0.2)

## 2022-09-10 LAB — URINE CULTURE: Culture: NO GROWTH

## 2022-09-10 MED ORDER — AMOXICILLIN-POT CLAVULANATE 500-125 MG PO TABS: 1.0000 | ORAL_TABLET | Freq: Three times a day (TID) | ORAL | 0 refills | Status: AC

## 2022-09-10 MED ORDER — LACTINEX PO CHEW: 1.0000 | CHEWABLE_TABLET | Freq: Three times a day (TID) | ORAL | 0 refills | Status: AC

## 2022-09-10 MED ORDER — NICOTINE 21 MG/24HR TD PT24: 21.0000 mg | MEDICATED_PATCH | Freq: Every day | TRANSDERMAL | 0 refills | Status: DC

## 2022-09-10 MED ORDER — IBUPROFEN 200 MG PO TABS: 400.0000 mg | ORAL_TABLET | Freq: Three times a day (TID) | ORAL | 0 refills | Status: AC | PRN

## 2022-09-10 MED ORDER — OXYCODONE HCL 5 MG PO TABS: 5.0000 mg | ORAL_TABLET | ORAL | 0 refills | Status: AC | PRN

## 2022-09-10 NOTE — Progress Notes (Signed)
[facility] Progress Note     Subjective: Patient seen and examined.  He is resting comfortably in bed.  He was able to tolerate his diet without nausea and vomiting.  He has been afebrile for greater than 24 hours.  He is passing flatus.  His drain has had 30 cc of serosanguineous output in the last 24 hours.  Objective: Vital signs in last 24 hours: Temp:  [97.8 F (36.6 C)-99.1 F (37.3 C)] 98 F (36.7 C) (03/02 0544) Pulse Rate:  [63-69] 63 (03/02 0544) Resp:  [18-20] 18 (03/02 0544) BP: (93-122)/(58-77) 122/75 (03/02 0544) SpO2:  [98 %-100 %] 100 % (03/02 0544) Last BM Date : 09/09/22  Intake/Output from previous day: 03/01 0701 - 03/02 0700 In: 1713.3 [P.O.:720; I.V.:855.1; IV Piggyback:138.2] Out: 30 [Drains:30] Intake/Output this shift: Total I/O In: 240 [P.O.:240] Out: -   General appearance: alert, cooperative, and no distress GI: Abdomen soft, nondistended, no percussion tenderness, nontender to palpation; no rigidity, guarding, rebound tenderness; left lower quadrant drain in place with minimal serosanguineous output in bulb  Lab Results:  Recent Labs    09/09/22 0455 09/10/22 0418  WBC 12.4* 8.4  HGB 10.9* 10.6*  HCT 34.4* 32.9*  PLT 261 250   BMET Recent Labs    09/07/22 2033 09/08/22 0400  NA 133* 132*  K 3.6 3.7  CL 95* 98  CO2 27 25  GLUCOSE 113* 115*  BUN 11 12  CREATININE 1.07 0.98  CALCIUM 8.4* 8.1*   PT/INR Recent Labs    09/08/22 0804  LABPROT 13.9  INR 1.1    Studies/Results: CT GUIDED PERITONEAL/RETROPERITONEAL FLUID DRAIN BY PERC CATH  Result Date: 09/08/2022 INDICATION: 62 year old male with history of diverticular abscess. EXAM: CT PERC DRAIN PERITONEAL ABCESS COMPARISON:  CT abdomen pelvis from 09/07/2022 MEDICATIONS: The patient is currently admitted to the hospital and receiving intravenous antibiotics. The antibiotics were administered within an appropriate time frame prior to the initiation of the  procedure. ANESTHESIA/SEDATION: Moderate (conscious) sedation was employed during this procedure. A total of Versed 2 mg and Fentanyl 125 mcg was administered intravenously. Moderate Sedation Time: 13 minutes. The patient's level of consciousness and vital signs were monitored continuously by radiology nursing throughout the procedure under my direct supervision. CONTRAST:  None COMPLICATIONS: None immediate. PROCEDURE: RADIATION DOSE REDUCTION: This exam was performed according to the departmental dose-optimization program which includes automated exposure control, adjustment of the mA and/or kV according to patient size and/or use of iterative reconstruction technique. Informed written consent was obtained from the patient after a discussion of the risks, benefits and alternatives to treatment. The patient was placed supine on the CT gantry and a pre procedural CT was performed re-demonstrating the known abscess/fluid collection within the left hemipelvis. The procedure was planned. A timeout was performed prior to the initiation of the procedure. The left lower quadrant was prepped and draped in the usual sterile fashion. The overlying soft tissues were anesthetized with 1% lidocaine with epinephrine. Appropriate trajectory was planned with the use of a 22 gauge spinal needle. An 18 gauge trocar needle was advanced into the abscess/fluid collection and a short Amplatz super stiff wire was coiled within the collection. Appropriate positioning was confirmed with a limited CT scan. The tract was serially dilated allowing placement of a 10 [place] all-purpose drainage catheter. Appropriate positioning was confirmed with a limited postprocedural CT scan. Approximately 10 ml of purulent fluid was aspirated. The tube was connected to a bulb suction and sutured in place.  A dressing was placed. The patient tolerated the procedure well without immediate post procedural complication. IMPRESSION: Successful CT guided  placement of a 10 French all purpose drain catheter into the left pelvic diverticular abscess with aspiration of approximately 10 mL of purulent fluid. Samples were sent to the laboratory as requested by the ordering clinical team. Ruthann Cancer, MD [facility] Radiology Electronically Signed   By: Ruthann Cancer M.D.   On: 09/08/2022 13:00    Anti-infectives: Anti-infectives (From admission, onward)    Start     Dose/Rate Route Frequency Ordered Stop   09/10/22 0000  amoxicillin-clavulanate (AUGMENTIN) 500-125 MG tablet        1 tablet Oral 3 times daily 09/10/22 1030 09/20/22 2359   09/08/22 0600  piperacillin-tazobactam (ZOSYN) IVPB 3.375 g  Status:  Discontinued        3.375 g 100 mL/hr over 30 Minutes Intravenous Every 8 hours 09/08/22 0418 09/08/22 0420   09/08/22 0600  piperacillin-tazobactam (ZOSYN) IVPB 3.375 g        3.375 g 12.5 mL/hr over 240 Minutes Intravenous Every 8 hours 09/08/22 0420     09/07/22 2230  piperacillin-tazobactam (ZOSYN) IVPB 3.375 g        3.375 g 100 mL/hr over 30 Minutes Intravenous  Once 09/07/22 2220 09/07/22 2314       Assessment/Plan:  Patient is a 62 year old male who was admitted with diverticulitis with associated pericolonic abscess.  Status post IR guided drain placement on 2/29.  -Leukocytosis has resolved this morning -Zosyn while inpatient -Regular diet -PRN pain medications and antiemetics -No plan for surgery -Patient will need colonoscopy in 6 to 8 weeks, and follow-up with IR for evaluation of drain -Patient to follow-up with me in 1 month -Stable for discharge from general surgery standpoint    LOS: 3 days    Rawson Minix A Ricki Vanhandel 09/10/2022

## 2022-09-10 NOTE — Progress Notes (Signed)
Nsg Discharge Note  Admit Date:  09/07/2022 Discharge date: 09/10/2022   LENDEL VALLETTA to be D/C'd Home per MD order.  AVS completed.  Copy for chart, and copy for patient signed, and dated. Patient/caregiver able to verbalize understanding.  Discharge Medication: Allergies as of 09/10/2022   No Known Allergies      Medication List     STOP taking these medications    GOODYS BODY PAIN PO       TAKE these medications    acetaminophen 325 MG tablet Commonly known as: TYLENOL Take 650 mg by mouth every 6 (six) hours as needed for moderate pain.   amoxicillin-clavulanate 500-125 MG tablet Commonly known as: Augmentin Take 1 tablet by mouth 3 (three) times daily for 10 days.   ibuprofen 200 MG tablet Commonly known as: ADVIL Take 2 tablets (400 mg total) by mouth every 8 (eight) hours as needed for moderate pain.   lactobacillus acidophilus & bulgar chewable tablet Chew 1 tablet by mouth 3 (three) times daily with meals for 10 days.   nicotine 21 mg/24hr patch Commonly known as: NICODERM CQ - dosed in mg/24 hours Place 1 patch (21 mg total) onto the skin daily. Start taking on: September 11, 2022   oxyCODONE 5 MG immediate release tablet Commonly known as: Oxy IR/ROXICODONE Take 1 tablet (5 mg total) by mouth every 4 (four) hours as needed for up to 3 days for moderate pain.               Discharge Care Instructions  (From admission, onward)           Start     Ordered   09/10/22 0000  Discharge wound care:       Comments: Continue drain care per instructions, follow-up closely with general surgery in 1 week Follow-up with interventional radiologist in [place] in 1 week   09/10/22 1030   09/09/22 0000  Change dressing (specify)       Comments: Dressing change: as needed to keep site clean and dry   09/09/22 1334            Discharge Assessment: Vitals:   09/09/22 2139 09/10/22 0544  BP: (!) 93/58 122/75  Pulse: 66 63  Resp: 20 18  Temp:  98.9 F (37.2 C) 98 F (36.7 C)  SpO2: 98% 100%   Skin clean, dry and intact without evidence of skin break down, no evidence of skin tears noted. IV catheter discontinued intact. Site without signs and symptoms of complications - no redness or edema noted at insertion site, patient denies c/o pain - only slight tenderness at site.  Dressing with slight pressure applied.  D/c Instructions-Education: Discharge instructions given to patient/family with verbalized understanding. D/c education completed with patient/family including follow up instructions, medication list, d/c activities limitations if indicated, with other d/c instructions as indicated by MD - patient able to verbalize understanding, all questions fully answered. Patient instructed to return to ED, call 911, or call MD for any changes in condition.  Patient escorted via [place], and D/C home via private auto.  Clovis Fredrickson, LPN [id number] AM

## 2022-09-10 NOTE — Discharge Summary (Signed)
Physician Discharge Summary   Patient: James Blake MRN: [id number] DOB: 1960/09/02  Admit date:     09/07/2022  Discharge date: 09/10/22  Discharge Physician: [name]   PCP: Cory Munch, PA-C   Recommendations at discharge:  Continue drain care per instructions, follow-up closely with Blake surgery in 1 week Follow-up with interventional radiologist in [place] in 1 week   Discharge Diagnoses: Principal Problem:   Colonic diverticular abscess Active Problems:   Fever   Tobacco use disorder  Resolved Problems:   * No resolved hospital problems. *  Hospital Course:  TABOR TRUSSELL is a 62 y.o. male with medical history significant of tobacco use disorder and no other known medical conditions, presents the ED with a chief complaint of abdominal pain.  Patient reports it all started a few weeks ago with constipation.    Patient reports that got progressively worse through the weekend.  He also started having fever and chills.   He went to urgent care and they saw blood in his urine so they sent him to the ER to rule out stone.  In the ED a CT of the abdomen was done that identified diverticulitis with a large pericolonic abscess.    He has not noticed any melena or hematochezia.  He noticed excessive flatulence.  He is very specific about his pain when he tries to void.  It feels like a pressure on his bladder and not like a burning sensation in his penis.  He reports it feels like something is pushing down on his bladder.    Patient does smoke about 2 packs/day.  He reports he knows that he has to quit but has not done it yet.  He reports he used to drink very heavily but quit 7 years ago.  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  His last dose was last week.  He has not been vaccinated for COVID or flu.  Patient is full code.  ED: Temp 99.2, heart rate 65-89, respiratory rate 18, blood pressure  102/66-138/79 Leukocytosis at 15.4, hemoglobin 12.7, platelets 300 Chemistries unremarkable CT abdomen pelvis shows diverticulitis with a 6 by 3 x 7 x 5.5 cm pericolonic abscess and mild focal cystitis Patient was started on Zosyn, morphine, and given a 1.5 L saline bolus Blake surgery was consulted and recommended patient to be admitted to [facility] for IR to be consulted in the a.m. Admission requested for diverticulitis with abscess  Assessment and Plan: * Colonic diverticular abscess -Spiked a temperature of 102.7 overnight -This morning hemodynamically stable with temp of 98.2 BP 112/67 -WBC 14.7 >> 12.4>> 8.4  - Abdominal pain improved with IV pian meds  - improved - fever/chills, leukocytosis - CT abdomen pelvis shows diverticulitis with 6 by 3 x 7 x 5.5 cm pericolonic abscess with mild focal cystitis  -09/08/2022-status post left flank abdominal drain placement to the abscess Draining well   - Continue Zosyn 12 09/10/2022 switch to p.o. Augmentin--for total of 10 days of p.o. antibiotics - Leukocytosis-normalized -  Blake surgery consulted recommended discharge with p.o. antibiotics once stable follow-up as an outpatient   - Pain control with pain scale - Continue to monitor  Fever -Resolved - Likely due to diverticular abscess -Ruling out bacteremia, blood cultures obtained -was on IV antibiotics Zosyn >>> switch to p.o. Augmentin  Tobacco use disorder - Smokes 2 packs/day - Starting with one 21 mg nicotine patch,  but may need another         Pain control - [facility] Controlled Substance Reporting System database was reviewed. and patient was instructed, not to drive, operate heavy machinery, perform activities at heights, swimming or participation in water activities or provide baby-sitting services while on Pain, Sleep and Anxiety Medications; until their outpatient Physician has advised to do so again. Also recommended to not to take more than  prescribed Pain, Sleep and Anxiety Medications.  Consultants: IR, gen surg Procedures performed: Percutaneous abdominal drain placement by IR Disposition: Home Diet recommendation:  Discharge Diet Orders (From admission, onward)     Start     Ordered   09/10/22 0000  Diet - low sodium heart healthy        09/10/22 1030           Regular diet DISCHARGE MEDICATION: Allergies as of 09/10/2022   No Known Allergies      Medication List     STOP taking these medications    GOODYS BODY PAIN PO       TAKE these medications    acetaminophen 325 MG tablet Commonly known as: TYLENOL Take 650 mg by mouth every 6 (six) hours as needed for moderate pain.   amoxicillin-clavulanate 500-125 MG tablet Commonly known as: Augmentin Take 1 tablet by mouth 3 (three) times daily for 10 days.   ibuprofen 200 MG tablet Commonly known as: ADVIL Take 2 tablets (400 mg total) by mouth every 8 (eight) hours as needed for moderate pain.   lactobacillus acidophilus & bulgar chewable tablet Chew 1 tablet by mouth 3 (three) times daily with meals for 10 days.   nicotine 21 mg/24hr patch Commonly known as: NICODERM CQ - dosed in mg/24 hours Place 1 patch (21 mg total) onto the skin daily. Start taking on: September 11, 2022   oxyCODONE 5 MG immediate release tablet Commonly known as: Oxy IR/ROXICODONE Take 1 tablet (5 mg total) by mouth every 4 (four) hours as needed for up to 3 days for moderate pain.               Discharge Care Instructions  (From admission, onward)           Start     Ordered   09/10/22 0000  Discharge wound care:       Comments: Continue drain care per instructions, follow-up closely with Blake surgery in 1 week Follow-up with interventional radiologist in [place] in 1 week   09/10/22 1030   09/09/22 0000  Change dressing (specify)       Comments: Dressing change: as needed to keep site clean and dry   09/09/22 1334            Follow-up  Information     Suttle, Rosanne Ashing, MD Follow up.   Specialties: Interventional Radiology, Diagnostic Radiology, Radiology Why: Schedulers will contact you with date and time of follow-up appointment. Contact information: [address] [place] [address] [id number]                Discharge Exam: Danley Danker Weights   09/07/22 2009 09/08/22 0554  Weight: 77.1 kg 71.8 kg       Blake:  AAO x 3,  cooperative, no distress;   HEENT:  Normocephalic, PERRL, otherwise with in Normal limits   Neuro:  CNII-XII intact. , normal motor and sensation, reflexes intact   Lungs:   Clear to auscultation BL, Respirations unlabored,  No wheezes / crackles  Cardio:    S1/S2, RRR, No murmure, No Rubs or Gallops   Abdomen:  Right flank/abdominal drain in place, draining  soft, non-tender, bowel sounds active all four quadrants, no guarding or peritoneal signs.  Muscular  skeletal:  Limited exam -global generalized weaknesses - in bed, able to move all 4 extremities,   2+ pulses,  symmetric, No pitting edema  Skin:  Dry, warm to touch, negative for any Rashes,  Wounds: Please see nursing documentation          Condition at discharge: good  The results of significant diagnostics from this hospitalization (including imaging, microbiology, ancillary and laboratory) are listed below for reference.   Imaging Studies: CT GUIDED PERITONEAL/RETROPERITONEAL FLUID DRAIN BY PERC CATH  Result Date: 09/08/2022 INDICATION: 62 year old male with history of diverticular abscess. EXAM: CT PERC DRAIN PERITONEAL ABCESS COMPARISON:  CT abdomen pelvis from 09/07/2022 MEDICATIONS: The patient is currently admitted to the hospital and receiving intravenous antibiotics. The antibiotics were administered within an appropriate time frame prior to the initiation of the procedure. ANESTHESIA/SEDATION: Moderate (conscious) sedation was employed during this procedure. A total of Versed 2 mg and Fentanyl  125 mcg was administered intravenously. Moderate Sedation Time: 13 minutes. The patient's level of consciousness and vital signs were monitored continuously by radiology nursing throughout the procedure under my direct supervision. CONTRAST:  None COMPLICATIONS: None immediate. PROCEDURE: RADIATION DOSE REDUCTION: This exam was performed according to the departmental dose-optimization program which includes automated exposure control, adjustment of the mA and/or kV according to patient size and/or use of iterative reconstruction technique. Informed written consent was obtained from the patient after a discussion of the risks, benefits and alternatives to treatment. The patient was placed supine on the CT gantry and a pre procedural CT was performed re-demonstrating the known abscess/fluid collection within the left hemipelvis. The procedure was planned. A timeout was performed prior to the initiation of the procedure. The left lower quadrant was prepped and draped in the usual sterile fashion. The overlying soft tissues were anesthetized with 1% lidocaine with epinephrine. Appropriate trajectory was planned with the use of a 22 gauge spinal needle. An 18 gauge trocar needle was advanced into the abscess/fluid collection and a short Amplatz super stiff wire was coiled within the collection. Appropriate positioning was confirmed with a limited CT scan. The tract was serially dilated allowing placement of a 10 [place] all-purpose drainage catheter. Appropriate positioning was confirmed with a limited postprocedural CT scan. Approximately 10 ml of purulent fluid was aspirated. The tube was connected to a bulb suction and sutured in place. A dressing was placed. The patient tolerated the procedure well without immediate post procedural complication. IMPRESSION: Successful CT guided placement of a 10 French all purpose drain catheter into the left pelvic diverticular abscess with aspiration of approximately 10 mL of  purulent fluid. Samples were sent to the laboratory as requested by the ordering clinical team. Ruthann Cancer, MD [facility] Radiology Electronically Signed   By: Ruthann Cancer M.D.   On: 09/08/2022 13:00   CT Abdomen Pelvis W Contrast  Result Date: 09/07/2022 CLINICAL DATA:  Left lower quadrant abdominal pain. EXAM: CT ABDOMEN AND PELVIS WITH CONTRAST TECHNIQUE: Multidetector CT imaging of the abdomen and pelvis was performed using the standard protocol following bolus administration of intravenous contrast. RADIATION DOSE REDUCTION: This exam was performed according to the departmental dose-optimization program which includes automated exposure control, adjustment of the mA and/or kV according to patient size and/or use  of iterative reconstruction technique. CONTRAST:  180m OMNIPAQUE IOHEXOL 300 MG/ML  SOLN COMPARISON:  January 07, 2006 FINDINGS: Lower chest: No acute abnormality. Hepatobiliary: No focal liver abnormality is seen. No gallstones, gallbladder wall thickening, or biliary dilatation. Pancreas: Unremarkable. No pancreatic ductal dilatation or surrounding inflammatory changes. Spleen: Normal in size without focal abnormality. Adrenals/Urinary Tract: Adrenal glands are unremarkable. Kidneys are normal, without renal calculi, focal lesion, or hydronephrosis. The urinary bladder is moderately distended with mild posterior urinary bladder wall thickening seen. Stomach/Bowel: Stomach is within normal limits. Appendix appears normal. No evidence of bowel dilatation. Numerous diverticula are seen throughout a markedly thickened and inflamed sigmoid colon, with a 6.3 cm x 7.1 cm x 5.5 cm pericolonic abscess seen along the mid to distal sigmoid colon. Vascular/Lymphatic: Aortic atherosclerosis. Calcified right inguinal lymph nodes are seen. Reproductive: Prostate gland is mildly enlarged. Other: No abdominal wall hernia or abnormality. No abdominopelvic ascites.  Musculoskeletal: No acute or significant osseous findings. IMPRESSION: 1. Marked severity sigmoid diverticulitis with a 6.3 cm x 7.1 cm x 5.5 cm pericolonic abscess. 2. Mild focal cystitis involving the adjacent portion of the urinary bladder cannot be excluded. Correlation with urinalysis is recommended. 3. Prostatomegaly. 4. Aortic atherosclerosis. Aortic Atherosclerosis (ICD10-I70.0). Electronically Signed   By: TVirgina NorfolkM.D.   On: 09/07/2022 22:16    Microbiology: Results for orders placed or performed during the hospital encounter of 09/07/22  Urine Culture (for pregnant, neutropenic or urologic patients or patients with an indwelling urinary catheter)     Status: None   Collection Time: 09/07/22 11:38 PM   Specimen: Urine, Clean Catch  Result Value Ref Range Status   Specimen Description   Final    URINE, CLEAN CATCH [facility] [address], [place] [address]   Special Requests   Final    NONE [facility] [address], [place] [address]   Culture   Final    NO GROWTH [facility] [address] [place] [address]   Report Status 09/10/2022 FINAL  Final  Culture, blood (Routine X 2) w Reflex to ID Panel     Status: None (Preliminary result)   Collection Time: 09/09/22  7:51 AM   Specimen: BLOOD  Result Value Ref Range Status   Specimen Description BLOOD BLOOD RIGHT ARM  Final   Special Requests   Final    BOTTLES DRAWN AEROBIC AND ANAEROBIC Blood Culture results may not be optimal due to an excessive volume of blood received in culture bottles   Culture   Final    NO GROWTH < 24 HOURS [facility] at [facility] [address] [place] [address]   Report Status PENDING  Incomplete  Culture, blood (Routine X 2) w Reflex to ID Panel     Status: None (Preliminary result)   Collection Time: 09/09/22  7:57 AM   Specimen: BLOOD  Result Value Ref Range Status   Specimen Description  BLOOD BLOOD LEFT ARM  Final   Special Requests   Final    Blood Culture results may not be optimal due to an excessive volume of blood received in culture bottles BOTTLES DRAWN AEROBIC AND ANAEROBIC   Culture   Final    NO GROWTH < 24 HOURS [facility] [address] [place]  [id number]   Report Status PENDING  Incomplete    Labs: CBC: Recent Labs  Lab  09/07/22 2033 09/08/22 0400 09/09/22 0455 09/10/22 0418  WBC 15.4* 14.7* 12.4* 8.4  NEUTROABS 11.4* 11.2*  --   --   HGB 12.7* 11.9* 10.9* 10.6*  HCT 38.3* 36.5* 34.4* 32.9*  MCV 88.7 89.9 91.0 89.6  PLT 300 266 261 [id number]   Basic Metabolic Panel: Recent Labs  Lab 09/07/22 2033 09/08/22 0400  NA 133* 132*  K 3.6 3.7  CL 95* 98  CO2 27 25  GLUCOSE 113* 115*  BUN 11 12  CREATININE 1.07 0.98  CALCIUM 8.4* 8.1*  MG  --  2.3   Liver Function Tests: Recent Labs  Lab 09/08/22 0400  AST 13*  ALT 16  ALKPHOS 50  BILITOT 0.8  PROT 6.7  ALBUMIN 3.0*   CBG: No results for input(s): "GLUCAP" in the last 168 hours.  Discharge time spent: greater than 30 minutes.  Signed: [name], MD [facility] 09/10/2022

## 2022-09-12 ENCOUNTER — Other Ambulatory Visit: Payer: Self-pay | Admitting: Surgery

## 2022-09-12 DIAGNOSIS — K5720 Diverticulitis of large intestine with perforation and abscess without bleeding: Secondary | ICD-10-CM

## 2022-09-12 LAB — AEROBIC/ANAEROBIC CULTURE W GRAM STAIN (SURGICAL/DEEP WOUND)

## 2022-09-14 LAB — CULTURE, BLOOD (ROUTINE X 2)
Culture: NO GROWTH
Culture: NO GROWTH

## 2022-09-22 ENCOUNTER — Ambulatory Visit
Admission: RE | Admit: 2022-09-22 | Discharge: 2022-09-22 | Disposition: A | Discharge status: Home / Self Care | Payer: 59 | Source: Ambulatory Visit | Attending: Student | Admitting: Student

## 2022-09-22 ENCOUNTER — Ambulatory Visit
Admission: RE | Admit: 2022-09-22 | Discharge: 2022-09-22 | Disposition: A | Discharge status: Home / Self Care | Payer: 59 | Source: Ambulatory Visit | Attending: Surgery | Admitting: Surgery

## 2022-09-22 DIAGNOSIS — K5730 Diverticulosis of large intestine without perforation or abscess without bleeding: Secondary | ICD-10-CM | POA: Diagnosis not present

## 2022-09-22 DIAGNOSIS — K5720 Diverticulitis of large intestine with perforation and abscess without bleeding: Secondary | ICD-10-CM

## 2022-09-22 DIAGNOSIS — K6389 Other specified diseases of intestine: Secondary | ICD-10-CM | POA: Diagnosis not present

## 2022-09-22 DIAGNOSIS — Z452 Encounter for adjustment and management of vascular access device: Secondary | ICD-10-CM | POA: Diagnosis not present

## 2022-09-22 DIAGNOSIS — L02211 Cutaneous abscess of abdominal wall: Secondary | ICD-10-CM | POA: Diagnosis not present

## 2022-09-22 DIAGNOSIS — I898 Other specified noninfective disorders of lymphatic vessels and lymph nodes: Secondary | ICD-10-CM | POA: Diagnosis not present

## 2022-09-22 DIAGNOSIS — K5780 Diverticulitis of intestine, part unspecified, with perforation and abscess without bleeding: Secondary | ICD-10-CM | POA: Diagnosis not present

## 2022-09-22 MED ORDER — IOPAMIDOL (ISOVUE-300) INJECTION 61%
100.0000 mL | Freq: Once | INTRAVENOUS | Status: AC | PRN
  Administered 2022-09-22: 100 mL via INTRAVENOUS

## 2022-09-22 MED ORDER — IOPAMIDOL (ISOVUE-370) INJECTION 76%
20.0000 mL | Freq: Once | INTRAVENOUS | Status: AC | PRN
  Administered 2022-09-22: 20 mL

## 2022-09-22 NOTE — Progress Notes (Addendum)
Chief Complaint: Patient was seen in consultation today for diverticular abscess  at the request of Docia Barrier  Referring Physician(s): Pappyliou, Barnetta Chapel, DO [facility])  Supervising Physician: Sovereign Ramiro, Sharen Heck  History of Present Illness: James Blake is a 62 y.o. male with past medical history of tobacco use and substance abuse who presented to [facility] ED 09/07/22 with complaints of abdominal pain, constipation and inability to urinate. CT abd/pelvis w/contrast was obtained which showed:  1. Marked severity sigmoid diverticulitis with a 6.3 cm x 7.1 cm x 5.5 cm pericolonic abscess. 2. Mild focal cystitis involving the adjacent portion of the urinary bladder cannot be excluded. Correlation with urinalysis is recommended. 3. Prostatomegaly. 4. Aortic atherosclerosis.  General surgery was consulted who recommended conservative management and IR consultation for possible percutaneous aspiration/drainage. He underwent CT guided abscess aspiration/drain placement 09/08/22 in IR (Dr. Serafina Royals). Culture of the aspirate showed e.coli and strep constellatus. He was discharged home on 09/10/22 with PO Augmentin x 10 days. He presents to IR clinic today for drain evaluation.  James Blake denies any complaints, he just finished his antibiotics. He has not had any abdominal pain, fevers, chills, persistent vomiting or diarrhea. He is seeing Dr. Derryl Harbor the first week of April for a colonoscopy. He reports flushing the drain twice per day with 5 mL NS and having ~10 mL of clear yellow output daily. He has had no issues with flushing or leakage from the insertion site. He is in good spirits and is hopeful that the drain can come out today.  Past Medical History:  Diagnosis Date   Substance abuse [facility])     Past Surgical History:  Procedure Laterality Date   DENTAL SURGERY     HERNIA REPAIR      Allergies: Patient has no known  allergies.  Medications: Prior to Admission medications   Medication Sig Start Date End Date Taking? Authorizing Provider  acetaminophen (TYLENOL) 325 MG tablet Take 650 mg by mouth every 6 (six) hours as needed for moderate pain.    [provider]  ibuprofen (ADVIL) 200 MG tablet Take 2 tablets (400 mg total) by mouth every 8 (eight) hours as needed for moderate pain. 09/10/22   Shahmehdi, Valeria Batman, MD  nicotine (NICODERM CQ - DOSED IN MG/24 HOURS) 21 mg/24hr patch Place 1 patch (21 mg total) onto the skin daily. 09/11/22   [name], MD     Family History  Problem Relation Age of Onset   Diabetes Father    Heart failure Father     Social History   Socioeconomic History   Marital status: Divorced    Spouse name: Not on file   Number of children: Not on file   Years of education: Not on file   Highest education level: Not on file  Occupational History   Not on file  Tobacco Use   Smoking status: Every Day    Packs/day: 2.00    Years: 40.00    Additional pack years: 0.00    Total pack years: 80.00    Types: Cigarettes   Smokeless tobacco: Never  Substance and Sexual Activity   Alcohol use: Not Currently    Comment: rarely drinks etoh    Drug use: Yes    Frequency: 7.0 times per week    Types: Cocaine    Comment: smokes crack every day    Sexual activity: Not on file  Other Topics Concern   Not on file  Social History Narrative  Not on file   Social Determinants of Health   Financial Resource Strain: Not on file  Food Insecurity: No Food Insecurity (09/08/2022)   Hunger Vital Sign    Worried About Running Out of Food in the Last Year: Never true    Ran Out of Food in the Last Year: Never true  Transportation Needs: No Transportation Needs (09/08/2022)   PRAPARE - Hydrologist (Medical): No    Lack of Transportation (Non-Medical): No  Physical Activity: Not on file  Stress: Not on file  Social Connections: Not on file      Review of Systems: A 12 point ROS discussed and pertinent positives are indicated in the HPI above.  All other systems are negative.  Review of Systems  Constitutional:  Negative for chills and fever.  Respiratory:  Negative for shortness of breath.   Cardiovascular:  Negative for chest pain.  Gastrointestinal:  Negative for abdominal pain, constipation, diarrhea, nausea and vomiting.    Vital Signs: There were no vitals taken for this visit.  Physical Exam Vitals reviewed.  Constitutional:      General: He is not in acute distress. HENT:     Head: Normocephalic.  Pulmonary:     Effort: Pulmonary effort is normal.  Abdominal:     General: There is no distension.     Palpations: Abdomen is soft.     Tenderness: There is no abdominal tenderness.     Comments: (+) RLQ drain, suture in tact, no stat lock. Insertion site clean, dry, dressed appropriately. No erythema, edema or drainage.  Drain injected with 10 cc Omnipaque with (+) fistula  Skin:    General: Skin is warm and dry.     Coloration: Skin is not jaundiced.  Neurological:     Mental Status: He is alert and oriented to person, place, and time.  Psychiatric:        Mood and Affect: Mood normal.        Behavior: Behavior normal.        Thought Content: Thought content normal.        Judgment: Judgment normal.      Imaging: CT GUIDED PERITONEAL/RETROPERITONEAL FLUID DRAIN BY PERC CATH  Result Date: 09/08/2022 INDICATION: 62 year old male with history of diverticular abscess. EXAM: CT PERC DRAIN PERITONEAL ABCESS COMPARISON:  CT abdomen pelvis from 09/07/2022 MEDICATIONS: The patient is currently admitted to the hospital and receiving intravenous antibiotics. The antibiotics were administered within an appropriate time frame prior to the initiation of the procedure. ANESTHESIA/SEDATION: Moderate (conscious) sedation was employed during this procedure. A total of Versed 2 mg and Fentanyl 125 mcg was administered  intravenously. Moderate Sedation Time: 13 minutes. The patient's level of consciousness and vital signs were monitored continuously by radiology nursing throughout the procedure under my direct supervision. CONTRAST:  None COMPLICATIONS: None immediate. PROCEDURE: RADIATION DOSE REDUCTION: This exam was performed according to the departmental dose-optimization program which includes automated exposure control, adjustment of the mA and/or kV according to patient size and/or use of iterative reconstruction technique. Informed written consent was obtained from the patient after a discussion of the risks, benefits and alternatives to treatment. The patient was placed supine on the CT gantry and a pre procedural CT was performed re-demonstrating the known abscess/fluid collection within the left hemipelvis. The procedure was planned. A timeout was performed prior to the initiation of the procedure. The left lower quadrant was prepped and draped in the usual sterile fashion. The overlying  soft tissues were anesthetized with 1% lidocaine with epinephrine. Appropriate trajectory was planned with the use of a 22 gauge spinal needle. An 18 gauge trocar needle was advanced into the abscess/fluid collection and a short Amplatz super stiff wire was coiled within the collection. Appropriate positioning was confirmed with a limited CT scan. The tract was serially dilated allowing placement of a 10 [place] all-purpose drainage catheter. Appropriate positioning was confirmed with a limited postprocedural CT scan. Approximately 10 ml of purulent fluid was aspirated. The tube was connected to a bulb suction and sutured in place. A dressing was placed. The patient tolerated the procedure well without immediate post procedural complication. IMPRESSION: Successful CT guided placement of a 10 French all purpose drain catheter into the left pelvic diverticular abscess with aspiration of approximately 10 mL of purulent fluid. Samples were  sent to the laboratory as requested by the ordering clinical team. Ruthann Cancer, MD [facility] Electronically Signed   By: Ruthann Cancer M.D.   On: 09/08/2022 13:00   CT Abdomen Pelvis W Contrast  Result Date: 09/07/2022 CLINICAL DATA:  Left lower quadrant abdominal pain. EXAM: CT ABDOMEN AND PELVIS WITH CONTRAST TECHNIQUE: Multidetector CT imaging of the abdomen and pelvis was performed using the standard protocol following bolus administration of intravenous contrast. RADIATION DOSE REDUCTION: This exam was performed according to the departmental dose-optimization program which includes automated exposure control, adjustment of the mA and/or kV according to patient size and/or use of iterative reconstruction technique. CONTRAST:  113m OMNIPAQUE IOHEXOL 300 MG/ML  SOLN COMPARISON:  January 07, 2006 FINDINGS: Lower chest: No acute abnormality. Hepatobiliary: No focal liver abnormality is seen. No gallstones, gallbladder wall thickening, or biliary dilatation. Pancreas: Unremarkable. No pancreatic ductal dilatation or surrounding inflammatory changes. Spleen: Normal in size without focal abnormality. Adrenals/Urinary Tract: Adrenal glands are unremarkable. Kidneys are normal, without renal calculi, focal lesion, or hydronephrosis. The urinary bladder is moderately distended with mild posterior urinary bladder wall thickening seen. Stomach/Bowel: Stomach is within normal limits. Appendix appears normal. No evidence of bowel dilatation. Numerous diverticula are seen throughout a markedly thickened and inflamed sigmoid colon, with a 6.3 cm x 7.1 cm x 5.5 cm pericolonic abscess seen along the mid to distal sigmoid colon. Vascular/Lymphatic: Aortic atherosclerosis. Calcified right inguinal lymph nodes are seen. Reproductive: Prostate gland is mildly enlarged. Other: No abdominal wall hernia or abnormality. No abdominopelvic ascites. Musculoskeletal: No acute or  significant osseous findings. IMPRESSION: 1. Marked severity sigmoid diverticulitis with a 6.3 cm x 7.1 cm x 5.5 cm pericolonic abscess. 2. Mild focal cystitis involving the adjacent portion of the urinary bladder cannot be excluded. Correlation with urinalysis is recommended. 3. Prostatomegaly. 4. Aortic atherosclerosis. Aortic Atherosclerosis (ICD10-I70.0). Electronically Signed   By: TVirgina NorfolkM.D.   On: 09/07/2022 22:16    Labs:  CBC: Recent Labs    09/07/22 2033 09/08/22 0400 09/09/22 0455 09/10/22 0418  WBC 15.4* 14.7* 12.4* 8.4  HGB 12.7* 11.9* 10.9* 10.6*  HCT 38.3* 36.5* 34.4* 32.9*  PLT 300 266 261 250    COAGS: Recent Labs    09/08/22 0804  INR 1.1    BMP: Recent Labs    09/07/22 2033 09/08/22 0400  NA 133* 132*  K 3.6 3.7  CL 95* 98  CO2 27 25  GLUCOSE 113* 115*  BUN 11 12  CALCIUM 8.4* 8.1*  CREATININE 1.07 0.98  GFRNONAA >60 >60    LIVER FUNCTION TESTS: Recent Labs    09/08/22  0400  BILITOT 0.8  AST 13*  ALT 16  ALKPHOS 50  PROT 6.7  ALBUMIN 3.0*    TUMOR MARKERS: No results for input(s): "AFPTM", "CEA", "CA199", "CHROMGRNA" in the last 8760 hours.  Assessment:  62 y/o M admitted to [facility] 09/07/22 for diverticulitis with large pericolonic abscess who underwent percutaneous drain placement 2/29 in IR (Dr. Serafina Royals) seen today for drain follow up.  Patient reports flushing with 10 cc NS BID, no concerns with flushing or leakage. Output has been ~10 cc QD, thin yellow. No fevers, chills, abdominal pain, diarrhea, vomiting.   Drain injected with 10 cc Omnipaque showing persistent collection, predominantly in the pelvic floor. CT and drain injection reviewed by Dr. Dwaine Gale who recommends to d/c flushing, change to gravity bag and repeat drain injection only in 2 weeks. Patient states understanding and agreement to this plan. He will call our office with any questions or concerns prior to this appointment.  He was instructed to call general  surgery office to discuss proceeding with colonoscopy while drain is in place.  Electronically Signed: Joaquim Nam PA-C 09/22/2022, 1:03 PM   Please refer to Dr. Aurea Graff attestation of this note for management and plan.   I reviewed the history and physical examination and have formulated the assessment and plan in the presence of the patient.  Assessment and Plan: Agree with above plan of care.  A copy of this report was sent to the requesting provider on this date.  Electronically Signed: Paula Libra Janeal Abadi 09/22/2022, 3:37 PM   I spent a total of 15 Minutes in face to face in clinical consultation, greater than 50% of which was counseling/coordinating care for diverticular abscess drain.

## 2022-09-23 ENCOUNTER — Other Ambulatory Visit (HOSPITAL_COMMUNITY): Payer: Self-pay | Admitting: Surgery

## 2022-09-23 ENCOUNTER — Other Ambulatory Visit: Payer: Self-pay

## 2022-09-23 DIAGNOSIS — K5720 Diverticulitis of large intestine with perforation and abscess without bleeding: Secondary | ICD-10-CM

## 2022-09-25 ENCOUNTER — Encounter (HOSPITAL_COMMUNITY): Payer: Self-pay

## 2022-09-25 ENCOUNTER — Emergency Department (HOSPITAL_COMMUNITY)
Admission: EM | Admit: 2022-09-25 | Discharge: 2022-09-25 | Disposition: A | Discharge status: Home / Self Care | Payer: 59 | Attending: Emergency Medicine | Admitting: Emergency Medicine

## 2022-09-25 ENCOUNTER — Other Ambulatory Visit: Payer: Self-pay

## 2022-09-25 ENCOUNTER — Emergency Department (HOSPITAL_COMMUNITY): Payer: 59

## 2022-09-25 DIAGNOSIS — R1030 Lower abdominal pain, unspecified: Secondary | ICD-10-CM | POA: Diagnosis not present

## 2022-09-25 DIAGNOSIS — R109 Unspecified abdominal pain: Secondary | ICD-10-CM | POA: Diagnosis not present

## 2022-09-25 DIAGNOSIS — R1032 Left lower quadrant pain: Secondary | ICD-10-CM | POA: Diagnosis not present

## 2022-09-25 LAB — CBC WITH DIFFERENTIAL/PLATELET
Abs Immature Granulocytes: 0.03 10*3/uL (ref 0.00–0.07)
Basophils Absolute: 0.1 10*3/uL (ref 0.0–0.1)
Basophils Relative: 1 %
Eosinophils Absolute: 0.1 10*3/uL (ref 0.0–0.5)
Eosinophils Relative: 1 %
HCT: 41.3 % (ref 39.0–52.0)
Hemoglobin: 13.4 g/dL (ref 13.0–17.0)
Immature Granulocytes: 0 %
Lymphocytes Relative: 28 %
Lymphs Abs: 2.4 10*3/uL (ref 0.7–4.0)
MCH: 29.3 pg (ref 26.0–34.0)
MCHC: 32.4 g/dL (ref 30.0–36.0)
MCV: 90.2 fL (ref 80.0–100.0)
Monocytes Absolute: 0.6 10*3/uL (ref 0.1–1.0)
Monocytes Relative: 7 %
Neutro Abs: 5.5 10*3/uL (ref 1.7–7.7)
Neutrophils Relative %: 63 %
Platelets: 270 10*3/uL (ref 150–400)
RBC: 4.58 MIL/uL (ref 4.22–5.81)
RDW: 13.2 % (ref 11.5–15.5)
WBC: 8.7 10*3/uL (ref 4.0–10.5)
nRBC: 0 % (ref 0.0–0.2)

## 2022-09-25 LAB — BASIC METABOLIC PANEL
Anion gap: 8 (ref 5–15)
BUN: 14 mg/dL (ref 8–23)
CO2: 27 mmol/L (ref 22–32)
Calcium: 8.7 mg/dL — ABNORMAL LOW (ref 8.9–10.3)
Chloride: 99 mmol/L (ref 98–111)
Creatinine, Ser: 0.87 mg/dL (ref 0.61–1.24)
GFR, Estimated: >60 mL/min (ref >60)
Glucose, Bld: 112 mg/dL — ABNORMAL HIGH (ref 70–99)
Potassium: 3.7 mmol/L (ref 3.5–5.1)
Sodium: 134 mmol/L — ABNORMAL LOW (ref 135–145)

## 2022-09-25 LAB — URINALYSIS, ROUTINE W REFLEX MICROSCOPIC
Bilirubin Urine: NEGATIVE
Glucose, UA: NEGATIVE mg/dL
Hgb urine dipstick: NEGATIVE
Ketones, ur: NEGATIVE mg/dL
Leukocytes,Ua: NEGATIVE
Nitrite: NEGATIVE
Protein, ur: NEGATIVE mg/dL
Specific Gravity, Urine: 1.004 — ABNORMAL LOW (ref 1.005–1.030)
pH: 7 (ref 5.0–8.0)

## 2022-09-25 MED ORDER — OXYCODONE-ACETAMINOPHEN 5-325 MG PO TABS
2.0000 | ORAL_TABLET | Freq: Once | ORAL | Status: AC
  Administered 2022-09-25: 2 via ORAL
  Filled 2022-09-25: qty 2

## 2022-09-25 MED ORDER — IOHEXOL 300 MG/ML  SOLN
100.0000 mL | Freq: Once | INTRAMUSCULAR | Status: AC | PRN
  Administered 2022-09-25: 100 mL via INTRAVENOUS

## 2022-09-25 NOTE — ED Provider Notes (Signed)
[place] [place] Provider Note   CSN: [id number] Arrival date & time: 09/25/22  1428     History {Add pertinent medical, surgical, social history, OB history to HPI:1} Chief Complaint  Patient presents with   Abdominal Pain    James Blake is a 62 y.o. male.   Abdominal Pain  This patient is a 62 year old male who unfortunately has a complicated medical history over the last couple of weeks when he developed diverticulitis , this was a colonic diverticular abscess that required a JP drain.  He actually finished his antibiotics was doing well and had very little drainage, on Thursday approximately 3 days ago he presented for evaluation of the drain at which time he had contrast injected, the CT scan that was performed on 14 March showed that there was resolution of the previously seen left pelvic diverticular abscess, there was some residual resolving diverticulitis.  The pelvic abscessogram demonstrated a continued abscess cavity and the drain was left in place.  His symptoms have worsened over the last 24 hours with the pelvic discomfort, no fevers chills nausea or vomiting  Home Medications Prior to Admission medications   Medication Sig Start Date End Date Taking? Authorizing Provider  acetaminophen (TYLENOL) 325 MG tablet Take 650 mg by mouth every 6 (six) hours as needed for moderate pain.    [provider]  ibuprofen (ADVIL) 200 MG tablet Take 2 tablets (400 mg total) by mouth every 8 (eight) hours as needed for moderate pain. 09/10/22   Shahmehdi, Valeria Batman, MD  nicotine (NICODERM CQ - DOSED IN MG/24 HOURS) 21 mg/24hr patch Place 1 patch (21 mg total) onto the skin daily. 09/11/22   [name], MD      Allergies    Patient has no known allergies.    Review of Systems   Review of Systems  Gastrointestinal:  Positive for abdominal pain.  All other systems reviewed and are negative.   Physical Exam Updated Vital  Signs BP 138/88 (BP Location: Right Arm)   Pulse 70   Temp 97.8 F (36.6 C) (Oral)   Resp 16   Ht 1.803 m (5\' 11" )   Wt 69.9 kg   SpO2 100%   BMI 21.48 kg/m  Physical Exam Vitals and nursing note reviewed.  Constitutional:      General: He is not in acute distress.    Appearance: He is well-developed.  HENT:     Head: Normocephalic and atraumatic.     Mouth/Throat:     Pharynx: No oropharyngeal exudate.  Eyes:     General: No scleral icterus.       Right eye: No discharge.        Left eye: No discharge.     Conjunctiva/sclera: Conjunctivae normal.     Pupils: Pupils are equal, round, and reactive to light.  Neck:     Thyroid: No thyromegaly.     Vascular: No JVD.  Cardiovascular:     Rate and Rhythm: Normal rate and regular rhythm.     Heart sounds: Normal heart sounds. No murmur heard.    No friction rub. No gallop.  Pulmonary:     Effort: Pulmonary effort is normal. No respiratory distress.     Breath sounds: Normal breath sounds. No wheezing or rales.  Abdominal:     General: Bowel sounds are normal. There is no distension.     Palpations: Abdomen is soft. There is no mass.  Tenderness: There is abdominal tenderness in the suprapubic area.  Musculoskeletal:        General: No tenderness. Normal range of motion.     Cervical back: Normal range of motion and neck supple.  Lymphadenopathy:     Cervical: No cervical adenopathy.  Skin:    General: Skin is warm and dry.     Findings: No erythema or rash.  Neurological:     Mental Status: He is alert.     Coordination: Coordination normal.  Psychiatric:        Behavior: Behavior normal.     ED Results / Procedures / Treatments   Labs (all labs ordered are listed, but only abnormal results are displayed) Labs Reviewed - No data to display  EKG None  Radiology No results found.  Procedures Procedures  {Document cardiac monitor, telemetry assessment procedure when appropriate:1}  Medications Ordered  in ED Medications - No data to display  ED Course/ Medical Decision Making/ A&P   {   Click here for ABCD2, HEART and other calculatorsREFRESH Note before signing :1}                          Medical Decision Making Amount and/or Complexity of Data Reviewed Labs: ordered. Radiology: ordered.   This patient presents to the ED for concern of persistent abdominal pelvic discomfort worsening over the last 24 hours, this involves an extensive number of treatment options, and is a complaint that carries with it a high risk of complications and morbidity.  The differential diagnosis includes recurrent abscess, ischemia, acute diverticulitis, UTI   Co morbidities that complicate the patient evaluation  Recent diverticular procedure with drain placement   Additional history obtained:  Additional history obtained from electronic medical record External records from outside source obtained and reviewed including prior surgical notes CT scans H&P and discharge summaries from prior admission   Lab Tests:  I Ordered, and personally interpreted labs.  The pertinent results include:  ***   Imaging Studies ordered:  I ordered imaging studies including ***  I independently visualized and interpreted imaging which showed *** I agree with the radiologist interpretation   Cardiac Monitoring: / EKG:  The patient was maintained on a cardiac monitor.  I personally viewed and interpreted the cardiac monitored which showed an underlying rhythm of: ***   Consultations Obtained:  I requested consultation with the ***,  and discussed lab and imaging findings as well as pertinent plan - they recommend: ***   Problem List / ED Course / Critical interventions / Medication management  *** I ordered medication including ***  for ***  Reevaluation of the patient after these medicines showed that the patient {resolved/improved/worsened:23923::"improved"} I have reviewed the patients home medicines  and have made adjustments as needed   Social Determinants of Health:  ***   Test / Admission - Considered:  ***   {Document critical care time when appropriate:1} {Document review of labs and clinical decision tools ie heart score, Chads2Vasc2 etc:1}  {Document your independent review of radiology images, and any outside records:1} {Document your discussion with family members, caretakers, and with consultants:1} {Document social determinants of health affecting pt's care:1} {Document your decision making why or why not admission, treatments were needed:1} Final Clinical Impression(s) / ED Diagnoses Final diagnoses:  None    Rx / DC Orders ED Discharge Orders     None

## 2022-09-25 NOTE — ED Triage Notes (Signed)
Pt has a JP drain for diverticulitis in the abdomen, Thursday he had a ct scan done where they injected dye through it. Since then he has been having pain in the lower abdomen and he said something doesn't feel right. He said it is not draining like it did before.

## 2022-09-25 NOTE — Discharge Instructions (Addendum)
Thankfully your testing is good, there is no recurrent infections or abscess or fluid.  All of your blood work and urine test were good.  Continue taking Tylenol or ibuprofen as needed for pain  Please see your surgeon or doctors within the next week for recheck as they will likely be able to take out the catheter  Thank you for allowing [place] to treat you in the emergency department today.  After reviewing your examination and potential testing that was done it appears that you are safe to go home.  I would like for you to follow-up with your doctor within the next several days, have them obtain your results and follow-up with them to review all of these tests.  If you should develop severe or worsening symptoms return to the emergency department immediately

## 2022-09-25 NOTE — ED Notes (Signed)
Pt given urinal and informed of need for urine sample 

## 2022-10-06 ENCOUNTER — Ambulatory Visit
Admission: RE | Admit: 2022-10-06 | Discharge: 2022-10-06 | Disposition: A | Discharge status: Home / Self Care | Payer: 59 | Source: Ambulatory Visit | Attending: Surgery | Admitting: Surgery

## 2022-10-06 DIAGNOSIS — K5780 Diverticulitis of intestine, part unspecified, with perforation and abscess without bleeding: Secondary | ICD-10-CM | POA: Diagnosis not present

## 2022-10-06 DIAGNOSIS — K5720 Diverticulitis of large intestine with perforation and abscess without bleeding: Secondary | ICD-10-CM

## 2022-10-06 DIAGNOSIS — Z4803 Encounter for change or removal of drains: Secondary | ICD-10-CM | POA: Diagnosis not present

## 2022-10-06 DIAGNOSIS — Z4682 Encounter for fitting and adjustment of non-vascular catheter: Secondary | ICD-10-CM | POA: Diagnosis not present

## 2022-10-06 MED ORDER — IOPAMIDOL (ISOVUE-370) INJECTION 76%
10.0000 mL | Freq: Once | INTRAVENOUS | Status: AC | PRN
  Administered 2022-10-06: 10 mL

## 2022-10-06 NOTE — Progress Notes (Signed)
Chief Complaint: Patient was seen in consultation today for Diverticular abscess- follow up at the request of JamesCatherine Blake  Referring Physician(s): Blake Blake  Supervising Physician: Blake Blake  History of Present Illness: Blake Blake is Blake 62 y.o. male   Diverticular abscess drain placed in IR 09/08/22 Fistulogram performed 3/14 revealed no fistula but drain cavity still apparent.  Drain was left in place CT 09/25/22: shows no residual fluid. Pt is here today for repeat fistulogram and possible drain pull.  Denies pain; fever; chills OP scant since last visit No flushes per IR instructions  To see Dr Blake Blake April 3    Past Medical History:  Diagnosis Date   Substance abuse [facility])     Past Surgical History:  Procedure Laterality Date   DENTAL SURGERY     HERNIA REPAIR      Allergies: Patient has no known allergies.  Medications: Prior to Admission medications   Medication Sig Start Date End Date Taking? Authorizing Provider  acetaminophen (TYLENOL) 325 MG tablet Take 650 mg by mouth every 6 (six) hours as needed for moderate pain.    [provider]  ibuprofen (ADVIL) 200 MG tablet Take 2 tablets (400 mg total) by mouth every 8 (eight) hours as needed for moderate pain. 09/10/22   Shahmehdi, Valeria Batman, MD  nicotine (NICODERM CQ - DOSED IN MG/24 HOURS) 21 mg/24hr patch Place 1 patch (21 mg total) onto the skin daily. 09/11/22   Blake James, MD     Family History  Problem Relation Age of Onset   Diabetes Father    Heart failure Father     Social History   Socioeconomic History   Marital status: Divorced    Spouse name: Not on file   Number of children: Not on file   Years of education: Not on file   Highest education level: Not on file  Occupational History   Not on file  Tobacco Use   Smoking status: Every Day    Packs/day: 2.00    Years: 40.00    Additional pack years: 0.00    Total pack years:  80.00    Types: Cigarettes   Smokeless tobacco: Never  Substance and Sexual Activity   Alcohol use: Not Currently    Comment: rarely drinks etoh    Drug use: Yes    Frequency: 7.0 times per week    Types: Cocaine    Comment: smokes crack every day    Sexual activity: Not on file  Other Topics Concern   Not on file  Social History Narrative   Not on file   Social Determinants of Health   Financial Resource Strain: Not on file  Food Insecurity: No Food Insecurity (09/08/2022)   Hunger Vital Sign    Worried About Running Out of Food in the Last Year: Never true    Ran Out of Food in the Last Year: Never true  Transportation Needs: No Transportation Needs (09/08/2022)   PRAPARE - Hydrologist (Medical): No    Lack of Transportation (Non-Medical): No  Physical Activity: Not on file  Stress: Not on file  Social Connections: Not on file    Review of Systems: Blake 12 point ROS discussed and pertinent positives are indicated in the HPI above.  All other systems are negative.    Vital Signs: There were no vitals taken for this visit.  Physical Exam Skin:    General: Skin is warm.  Comments: Site is clean and dry NT no bleeding Sutures are NOT intact but good placement of drain per imaging  OP in bag is serous color and minimal  Fistulogram reveals no fistula  Drain removal at bedside without complication Dressing placed        Imaging: CT ABDOMEN PELVIS W CONTRAST  Result Date: 09/25/2022 CLINICAL DATA:  LEFT lower quadrant pain. History of diverticular abscess with drain in place. EXAM: CT ABDOMEN AND PELVIS WITH CONTRAST TECHNIQUE: Multidetector CT imaging of the abdomen and pelvis was performed using the standard protocol following bolus administration of intravenous contrast. RADIATION DOSE REDUCTION: This exam was performed according to the departmental dose-optimization program which includes automated exposure control, adjustment of  the mA and/or kV according to patient size and/or use of iterative reconstruction technique. CONTRAST:  175mL OMNIPAQUE IOHEXOL 300 MG/ML  SOLN COMPARISON:  None Available. FINDINGS: Lower chest: Lung bases are clear. Hepatobiliary: No focal hepatic lesion. Normal gallbladder. No biliary duct dilatation. Common bile duct is normal. Pancreas: Pancreas is normal. No ductal dilatation. No pancreatic inflammation. Spleen: Normal spleen Adrenals/urinary tract: Adrenal glands and kidneys are normal. The ureters and bladder normal. Stomach/Bowel: Stomach, small bowel, appendix, and cecum are normal. The colon and rectosigmoid colon are normal. No evidence of abscess associated with the sigmoid colon. There are several diverticula through the sigmoid region. Percutaneous drainage catheter adjacent the sigmoid colon. No organized fluid collection identified. There is soft tissue thickening surrounding the coiled catheter tip without fluid collection (image 66/2) Vascular/Lymphatic: Abdominal aorta is normal caliber. No periportal or retroperitoneal adenopathy. No pelvic adenopathy. Reproductive: Prostate unremarkable Other: No free fluid. Musculoskeletal: No aggressive osseous lesion. IMPRESSION: 1. Percutaneous drainage catheter adjacent to the sigmoid colon. No fluid collection remains. Soft tissue thickening adjacent to the sigmoid colon surrounds the pigtail catheter. 2. No evidence of acute diverticulitis. Electronically Signed   By: Suzy Bouchard M.D.   On: 09/25/2022 17:04   DG Sinus/Fist Tube Chk-Non GI  Result Date: 09/22/2022 INDICATION: 61 year old gentleman history of diverticular abscess status post CT-guided drain placement on 09/08/2022 returns to IR clinic for drain evaluation. EXAM: Fluoroscopic pelvic abscessogram MEDICATIONS: The patient is currently admitted to the hospital and receiving intravenous antibiotics. The antibiotics were administered within an appropriate time frame prior to the  initiation of the procedure. ANESTHESIA/SEDATION: None COMPLICATIONS: None immediate. PROCEDURE: Informed written consent was obtained from the patient after Blake thorough discussion of the procedural risks, benefits and alternatives. Blake timeout was performed prior to the initiation of the procedure. Scout image demonstrates left pelvic drain in appropriate position. Contrast administered through the drain opacified Blake small residual abscess cavity as well as portions of the peritoneum. No fistulous communication identified. Drain flushed and attached to bag IMPRESSION: Pelvic abscessogram demonstrates continued abscess cavity. Drain left in place. Electronically Signed   By: Miachel Roux M.D.   On: 09/22/2022 14:25   CT ABDOMEN PELVIS W CONTRAST  Result Date: 09/22/2022 CLINICAL DATA:  62 year old gentleman with history of diverticular abscess status post CT-guided drain placement on 09/08/2022 returns to IR clinic for drain follow-up. EXAM: CT ABDOMEN AND PELVIS WITH CONTRAST TECHNIQUE: Multidetector CT imaging of the abdomen and pelvis was performed using the standard protocol following bolus administration of intravenous contrast. RADIATION DOSE REDUCTION: This exam was performed according to the departmental dose-optimization program which includes automated exposure control, adjustment of the mA and/or kV according to patient size and/or use of iterative reconstruction technique. CONTRAST:  142mL ISOVUE-300 IOPAMIDOL (ISOVUE-300) INJECTION  61% COMPARISON:  09/07/2022 FINDINGS: Lower chest: No acute abnormality. Hepatobiliary: No focal liver abnormality is seen. No gallstones, gallbladder wall thickening, or biliary dilatation. Pancreas: Unremarkable. No pancreatic ductal dilatation or surrounding inflammatory changes. Spleen: Normal in size without focal abnormality. Adrenals/Urinary Tract: Adrenal glands are unremarkable. Kidneys are normal, without renal calculi, focal lesion, or hydronephrosis. Evaluation of  the bladder limited due to under distension. Stomach/Bowel: No bowel dilatation to indicate ileus or obstruction. Appendix is normal. Interval resolution of previously seen left pelvic diverticular abscess with mild soft tissue thickening remaining along the abscess drain. The wall the sigmoid colon remains mildly thickened. Descending and sigmoid colon diverticula again seen. Vascular/Lymphatic: Enlarged abdominal or pelvic lymph nodes. Unchanged partially calcified right inguinal lymph node likely related to prior granulomatous inflammation. Reproductive: Mild-to-moderate prostatomegaly. Other: No abdominal wall hernia or abnormality. No abdominopelvic ascites. Musculoskeletal: 2.6 x 2.4 cm left intertrochanteric femoral lesion is only minimally larger compared to CT from 01/07/2006 where it measured 2.2 x 1.9 cm, which indicates benign etiology. No acute osseous abnormality. IMPRESSION: Interval resolution of previously seen left pelvic diverticular abscess with mild soft tissue thickening remaining along the drain pigtail. Mild residual thickening of the sigmoid colon wall consistent with resolving diverticulitis. Electronically Signed   By: Miachel Roux M.D.   On: 09/22/2022 13:26   CT GUIDED PERITONEAL/RETROPERITONEAL FLUID DRAIN BY PERC CATH  Result Date: 09/08/2022 INDICATION: 62 year old male with history of diverticular abscess. EXAM: CT PERC DRAIN PERITONEAL ABCESS COMPARISON:  CT abdomen pelvis from 09/07/2022 MEDICATIONS: The patient is currently admitted to the hospital and receiving intravenous antibiotics. The antibiotics were administered within an appropriate time frame prior to the initiation of the procedure. ANESTHESIA/SEDATION: Moderate (conscious) sedation was employed during this procedure. Blake total of Versed 2 mg and Fentanyl 125 mcg was administered intravenously. Moderate Sedation Time: 13 minutes. The patient's level of consciousness and vital signs were monitored continuously by  radiology nursing throughout the procedure under my direct supervision. CONTRAST:  None COMPLICATIONS: None immediate. PROCEDURE: RADIATION DOSE REDUCTION: This exam was performed according to the departmental dose-optimization program which includes automated exposure control, adjustment of the mA and/or kV according to patient size and/or use of iterative reconstruction technique. Informed written consent was obtained from the patient after Blake discussion of the risks, benefits and alternatives to treatment. The patient was placed supine on the CT gantry and Blake pre procedural CT was performed re-demonstrating the known abscess/fluid collection within the left hemipelvis. The procedure was planned. Blake timeout was performed prior to the initiation of the procedure. The left lower quadrant was prepped and draped in the usual sterile fashion. The overlying soft tissues were anesthetized with 1% lidocaine with epinephrine. Appropriate trajectory was planned with the use of Blake 22 gauge spinal needle. An 18 gauge trocar needle was advanced into the abscess/fluid collection and Blake short Amplatz super stiff wire was coiled within the collection. Appropriate positioning was confirmed with Blake limited CT scan. The tract was serially dilated allowing placement of Blake 10 [place] all-purpose drainage catheter. Appropriate positioning was confirmed with Blake limited postprocedural CT scan. Approximately 10 ml of purulent fluid was aspirated. The tube was connected to Blake bulb suction and sutured in place. Blake dressing was placed. The patient tolerated the procedure well without immediate post procedural complication. IMPRESSION: Successful CT guided placement of Blake 10 French all purpose drain catheter into the left pelvic diverticular abscess with aspiration of approximately 10 mL of purulent fluid. Samples were sent to the  laboratory as requested by the ordering clinical team. Ruthann Cancer, MD [facility]  [facility] Electronically Signed   By: Ruthann Cancer M.D.   On: 09/08/2022 13:00   CT Abdomen Pelvis W Contrast  Result Date: 09/07/2022 CLINICAL DATA:  Left lower quadrant abdominal pain. EXAM: CT ABDOMEN AND PELVIS WITH CONTRAST TECHNIQUE: Multidetector CT imaging of the abdomen and pelvis was performed using the standard protocol following bolus administration of intravenous contrast. RADIATION DOSE REDUCTION: This exam was performed according to the departmental dose-optimization program which includes automated exposure control, adjustment of the mA and/or kV according to patient size and/or use of iterative reconstruction technique. CONTRAST:  146mL OMNIPAQUE IOHEXOL 300 MG/ML  SOLN COMPARISON:  January 07, 2006 FINDINGS: Lower chest: No acute abnormality. Hepatobiliary: No focal liver abnormality is seen. No gallstones, gallbladder wall thickening, or biliary dilatation. Pancreas: Unremarkable. No pancreatic ductal dilatation or surrounding inflammatory changes. Spleen: Normal in size without focal abnormality. Adrenals/Urinary Tract: Adrenal glands are unremarkable. Kidneys are normal, without renal calculi, focal lesion, or hydronephrosis. The urinary bladder is moderately distended with mild posterior urinary bladder wall thickening seen. Stomach/Bowel: Stomach is within normal limits. Appendix appears normal. No evidence of bowel dilatation. Numerous diverticula are seen throughout Blake markedly thickened and inflamed sigmoid colon, with Blake 6.3 cm x 7.1 cm x 5.5 cm pericolonic abscess seen along the mid to distal sigmoid colon. Vascular/Lymphatic: Aortic atherosclerosis. Calcified right inguinal lymph nodes are seen. Reproductive: Prostate gland is mildly enlarged. Other: No abdominal wall hernia or abnormality. No abdominopelvic ascites. Musculoskeletal: No acute or significant osseous findings. IMPRESSION: 1. Marked severity sigmoid diverticulitis with Blake 6.3 cm x 7.1 cm x 5.5 cm pericolonic  abscess. 2. Mild focal cystitis involving the adjacent portion of the urinary bladder cannot be excluded. Correlation with urinalysis is recommended. 3. Prostatomegaly. 4. Aortic atherosclerosis. Aortic Atherosclerosis (ICD10-I70.0). Electronically Signed   By: Virgina Norfolk M.D.   On: 09/07/2022 22:16    Labs:  CBC: Recent Labs    09/08/22 0400 09/09/22 0455 09/10/22 0418 09/25/22 1516  WBC 14.7* 12.4* 8.4 8.7  HGB 11.9* 10.9* 10.6* 13.4  HCT 36.5* 34.4* 32.9* 41.3  PLT 266 261 250 270    COAGS: Recent Labs    09/08/22 0804  INR 1.1    BMP: Recent Labs    09/07/22 2033 09/08/22 0400 09/25/22 1516  NA 133* 132* 134*  K 3.6 3.7 3.7  CL 95* 98 99  CO2 27 25 27   GLUCOSE 113* 115* 112*  BUN 11 12 14   CALCIUM 8.4* 8.1* 8.7*  CREATININE 1.07 0.98 0.87  GFRNONAA >60 >60 >60    LIVER FUNCTION TESTS: Recent Labs    09/08/22 0400  BILITOT 0.8  AST 13*  ALT 16  ALKPHOS 50  PROT 6.7  ALBUMIN 3.0*    TUMOR MARKERS: No results for input(s): "AFPTM", "CEA", "CA199", "CHROMGRNA" in the last 8760 hours.  Assessment:  Diverticular abscess drain placed 09/08/22 in IR Fistulogram today show NO fistula per Dr Morrie Sheldon removal without complication Dressing placed To see Dr Blake Blake October 12, 2022    Electronically Signed: Lavonia Drafts PA-C 10/06/2022, 1:07 PM   Please refer to Dr. Kathlene Cote attestation of this note for management and plan.

## 2022-10-12 ENCOUNTER — Ambulatory Visit: Payer: 59 | Admitting: Surgery

## 2023-02-01 DIAGNOSIS — Z125 Encounter for screening for malignant neoplasm of prostate: Secondary | ICD-10-CM | POA: Diagnosis not present

## 2023-02-01 DIAGNOSIS — Z0001 Encounter for general adult medical examination with abnormal findings: Secondary | ICD-10-CM | POA: Diagnosis not present

## 2023-02-01 DIAGNOSIS — Z1331 Encounter for screening for depression: Secondary | ICD-10-CM | POA: Diagnosis not present

## 2023-02-01 DIAGNOSIS — Z6822 Body mass index (BMI) 22.0-22.9, adult: Secondary | ICD-10-CM | POA: Diagnosis not present

## 2023-03-14 ENCOUNTER — Encounter: Payer: Self-pay | Admitting: Internal Medicine

## 2023-03-20 ENCOUNTER — Encounter: Payer: Self-pay | Admitting: Internal Medicine

## 2023-04-20 ENCOUNTER — Other Ambulatory Visit (HOSPITAL_COMMUNITY): Payer: Self-pay | Admitting: Family Medicine

## 2023-04-20 DIAGNOSIS — R52 Pain, unspecified: Secondary | ICD-10-CM

## 2023-04-27 ENCOUNTER — Ambulatory Visit (HOSPITAL_COMMUNITY)
Admission: RE | Admit: 2023-04-27 | Discharge: 2023-04-27 | Disposition: A | Discharge status: Home / Self Care | Payer: 59 | Source: Ambulatory Visit | Attending: Family Medicine | Admitting: Family Medicine

## 2023-04-27 DIAGNOSIS — M4802 Spinal stenosis, cervical region: Secondary | ICD-10-CM | POA: Diagnosis not present

## 2023-04-27 DIAGNOSIS — M4313 Spondylolisthesis, cervicothoracic region: Secondary | ICD-10-CM | POA: Diagnosis not present

## 2023-04-27 DIAGNOSIS — M50322 Other cervical disc degeneration at C5-C6 level: Secondary | ICD-10-CM | POA: Diagnosis not present

## 2023-04-27 DIAGNOSIS — R52 Pain, unspecified: Secondary | ICD-10-CM | POA: Insufficient documentation

## 2023-06-12 DIAGNOSIS — M542 Cervicalgia: Secondary | ICD-10-CM | POA: Diagnosis not present

## 2023-06-12 DIAGNOSIS — Z6822 Body mass index (BMI) 22.0-22.9, adult: Secondary | ICD-10-CM | POA: Diagnosis not present

## 2023-10-19 DIAGNOSIS — K5732 Diverticulitis of large intestine without perforation or abscess without bleeding: Secondary | ICD-10-CM | POA: Diagnosis not present

## 2023-10-19 DIAGNOSIS — M542 Cervicalgia: Secondary | ICD-10-CM | POA: Diagnosis not present

## 2023-10-19 DIAGNOSIS — R109 Unspecified abdominal pain: Secondary | ICD-10-CM | POA: Diagnosis not present

## 2023-10-19 DIAGNOSIS — Z6822 Body mass index (BMI) 22.0-22.9, adult: Secondary | ICD-10-CM | POA: Diagnosis not present

## 2024-01-15 ENCOUNTER — Inpatient Hospital Stay (HOSPITAL_COMMUNITY)
Admission: EM | Admit: 2024-01-15 | Discharge: 2024-01-17 | DRG: 391 | Disposition: A | Discharge status: Home / Self Care | Attending: Internal Medicine | Admitting: Internal Medicine

## 2024-01-15 ENCOUNTER — Other Ambulatory Visit: Payer: Self-pay

## 2024-01-15 ENCOUNTER — Encounter (HOSPITAL_COMMUNITY): Payer: Self-pay

## 2024-01-15 ENCOUNTER — Emergency Department (HOSPITAL_COMMUNITY)

## 2024-01-15 DIAGNOSIS — F17210 Nicotine dependence, cigarettes, uncomplicated: Secondary | ICD-10-CM | POA: Diagnosis present

## 2024-01-15 DIAGNOSIS — K5720 Diverticulitis of large intestine with perforation and abscess without bleeding: Principal | ICD-10-CM | POA: Diagnosis present

## 2024-01-15 DIAGNOSIS — Z8249 Family history of ischemic heart disease and other diseases of the circulatory system: Secondary | ICD-10-CM | POA: Diagnosis not present

## 2024-01-15 DIAGNOSIS — K630 Abscess of intestine: Secondary | ICD-10-CM | POA: Diagnosis not present

## 2024-01-15 DIAGNOSIS — Z833 Family history of diabetes mellitus: Secondary | ICD-10-CM

## 2024-01-15 DIAGNOSIS — F1490 Cocaine use, unspecified, uncomplicated: Secondary | ICD-10-CM | POA: Diagnosis present

## 2024-01-15 DIAGNOSIS — N3289 Other specified disorders of bladder: Secondary | ICD-10-CM | POA: Diagnosis not present

## 2024-01-15 DIAGNOSIS — Z79899 Other long term (current) drug therapy: Secondary | ICD-10-CM

## 2024-01-15 DIAGNOSIS — K651 Peritoneal abscess: Secondary | ICD-10-CM | POA: Diagnosis not present

## 2024-01-15 DIAGNOSIS — K8689 Other specified diseases of pancreas: Secondary | ICD-10-CM | POA: Diagnosis not present

## 2024-01-15 DIAGNOSIS — Z538 Procedure and treatment not carried out for other reasons: Secondary | ICD-10-CM | POA: Diagnosis not present

## 2024-01-15 DIAGNOSIS — R309 Painful micturition, unspecified: Secondary | ICD-10-CM | POA: Diagnosis present

## 2024-01-15 DIAGNOSIS — R102 Pelvic and perineal pain: Secondary | ICD-10-CM | POA: Diagnosis not present

## 2024-01-15 DIAGNOSIS — K5780 Diverticulitis of intestine, part unspecified, with perforation and abscess without bleeding: Secondary | ICD-10-CM | POA: Diagnosis not present

## 2024-01-15 HISTORY — DX: Diverticulitis of intestine, part unspecified, without perforation or abscess without bleeding: K57.92

## 2024-01-15 LAB — COMPREHENSIVE METABOLIC PANEL WITH GFR
ALT: 13 U/L (ref 0–44)
AST: 14 U/L — ABNORMAL LOW (ref 15–41)
Albumin: 3.7 g/dL (ref 3.5–5.0)
Alkaline Phosphatase: 59 U/L (ref 38–126)
Anion gap: 10 (ref 5–15)
BUN: 18 mg/dL (ref 8–23)
CO2: 23 mmol/L (ref 22–32)
Calcium: 9.1 mg/dL (ref 8.9–10.3)
Chloride: 102 mmol/L (ref 98–111)
Creatinine, Ser: 0.85 mg/dL (ref 0.61–1.24)
GFR, Estimated: >60 mL/min (ref >60)
Glucose, Bld: 97 mg/dL (ref 70–99)
Potassium: 4.3 mmol/L (ref 3.5–5.1)
Sodium: 135 mmol/L (ref 135–145)
Total Bilirubin: 0.6 mg/dL (ref 0.0–1.2)
Total Protein: 7.3 g/dL (ref 6.5–8.1)

## 2024-01-15 LAB — URINALYSIS, ROUTINE W REFLEX MICROSCOPIC
Bilirubin Urine: NEGATIVE
Glucose, UA: NEGATIVE mg/dL
Hgb urine dipstick: NEGATIVE
Ketones, ur: NEGATIVE mg/dL
Leukocytes,Ua: NEGATIVE
Nitrite: NEGATIVE
Protein, ur: NEGATIVE mg/dL
Specific Gravity, Urine: 1.014 (ref 1.005–1.030)
pH: 7 (ref 5.0–8.0)

## 2024-01-15 LAB — CBC WITH DIFFERENTIAL/PLATELET
Abs Immature Granulocytes: 0.03 K/uL (ref 0.00–0.07)
Basophils Absolute: 0.1 K/uL (ref 0.0–0.1)
Basophils Relative: 0 %
Eosinophils Absolute: 0.1 K/uL (ref 0.0–0.5)
Eosinophils Relative: 1 %
HCT: 45.2 % (ref 39.0–52.0)
Hemoglobin: 15 g/dL (ref 13.0–17.0)
Immature Granulocytes: 0 %
Lymphocytes Relative: 23 %
Lymphs Abs: 2.7 K/uL (ref 0.7–4.0)
MCH: 29.4 pg (ref 26.0–34.0)
MCHC: 33.2 g/dL (ref 30.0–36.0)
MCV: 88.6 fL (ref 80.0–100.0)
Monocytes Absolute: 0.9 K/uL (ref 0.1–1.0)
Monocytes Relative: 8 %
Neutro Abs: 8 K/uL — ABNORMAL HIGH (ref 1.7–7.7)
Neutrophils Relative %: 68 %
Platelets: 229 K/uL (ref 150–400)
RBC: 5.1 MIL/uL (ref 4.22–5.81)
RDW: 13.9 % (ref 11.5–15.5)
WBC: 11.8 K/uL — ABNORMAL HIGH (ref 4.0–10.5)
nRBC: 0 % (ref 0.0–0.2)

## 2024-01-15 LAB — LIPASE, BLOOD: Lipase: 30 U/L (ref 11–51)

## 2024-01-15 MED ORDER — MORPHINE SULFATE (PF) 4 MG/ML IV SOLN
4.0000 mg | INTRAVENOUS | Status: DC | PRN
  Administered 2024-01-15 – 2024-01-17 (×10): 4 mg via INTRAVENOUS
  Filled 2024-01-15 (×10): qty 1

## 2024-01-15 MED ORDER — PIPERACILLIN-TAZOBACTAM 3.375 G IVPB 30 MIN
3.3750 g | Freq: Once | INTRAVENOUS | Status: AC
  Administered 2024-01-15: 3.375 g via INTRAVENOUS
  Filled 2024-01-15: qty 50

## 2024-01-15 MED ORDER — PIPERACILLIN-TAZOBACTAM 3.375 G IVPB
3.3750 g | Freq: Three times a day (TID) | INTRAVENOUS | Status: DC
  Administered 2024-01-16 – 2024-01-17 (×6): 3.375 g via INTRAVENOUS
  Filled 2024-01-15 (×6): qty 50

## 2024-01-15 MED ORDER — IOHEXOL 300 MG/ML  SOLN
100.0000 mL | Freq: Once | INTRAMUSCULAR | Status: AC | PRN
  Administered 2024-01-15: 100 mL via INTRAVENOUS

## 2024-01-15 MED ORDER — SODIUM CHLORIDE 0.9 % IV BOLUS
1000.0000 mL | Freq: Once | INTRAVENOUS | Status: AC
  Administered 2024-01-15: 1000 mL via INTRAVENOUS

## 2024-01-15 MED ORDER — ONDANSETRON HCL 4 MG/2ML IJ SOLN
4.0000 mg | Freq: Once | INTRAMUSCULAR | Status: AC
  Administered 2024-01-15: 4 mg via INTRAVENOUS
  Filled 2024-01-15: qty 2

## 2024-01-15 MED ORDER — ENOXAPARIN SODIUM 40 MG/0.4ML IJ SOSY
40.0000 mg | PREFILLED_SYRINGE | INTRAMUSCULAR | Status: DC
  Administered 2024-01-15: 40 mg via SUBCUTANEOUS
  Filled 2024-01-15: qty 0.4

## 2024-01-15 MED ORDER — MORPHINE SULFATE (PF) 4 MG/ML IV SOLN
4.0000 mg | Freq: Once | INTRAVENOUS | Status: AC
  Administered 2024-01-15: 4 mg via INTRAVENOUS
  Filled 2024-01-15: qty 1

## 2024-01-15 MED ORDER — DEXTROSE-SODIUM CHLORIDE 5-0.9 % IV SOLN: INTRAVENOUS | Status: DC

## 2024-01-15 NOTE — H&P (Signed)
 History and Physical    James Blake FMW:[id number] DOB: [birth year] DOA: 01/15/2024  PCP: Marvine Rush, MD  Patient coming from: Home  I have personally briefly reviewed patient's old medical records in [facility]  Chief Complaint: Lower abdominal pain  HPI: James Blake is a 63 y.o. male with medical history significant for tobacco abuse, diverticulitis.   Patient presented to the ED with complaints of mid lower abdominal pain over the past few weeks.  No vomiting or diarrhea. Similar history 09/2022, diagnosed with colonic diverticular abscess, that required left flank abdominal drain placement.  He was lost to follow-up after discharge from the hospital. He has no significant past medical problems and does not take any medications.  ED Course: Temperature 98.  Heart rate 60-75.  Respiratory 20.  Blood pressure systolic 126-139.  O2 sats 95% on room air. WBC 11.8. CT abdomen and pelvis with contrast-Distal sigmoid colon diverticulitis with a diverticular abscess along the lower margin of a potential fistulous tract from the sigmoid colon, this abscess measures about 3.3 by 2.7 by 3.6 cm and sits just above the left bladder roof with secondary thickening of the left bladder roof. No gas in the urinary bladder lumen is identified to indicate connectivity with the urinary bladder lumen. General surgery consulted-IV antibiotics, no need for surgical intervention at this time, consult IR for percutaneous drainage.   Review of Systems: As per HPI all other systems reviewed and negative.  Past Medical History:  Diagnosis Date   Diverticulitis    Substance abuse [facility])     Past Surgical History:  Procedure Laterality Date   DENTAL SURGERY     HERNIA REPAIR       reports that he has been smoking cigarettes. He has a 80 pack-year smoking history. He has been exposed to tobacco smoke. He has never used smokeless tobacco. He reports that he does not currently use alcohol.  He reports current drug use. Frequency: 7.00 times per week. Drug: Cocaine.  No Known Allergies  Family History  Problem Relation Age of Onset   Diabetes Father    Heart failure Father    Prior to Admission medications   Medication Sig Start Date End Date Taking? Authorizing Provider  acetaminophen  (TYLENOL ) 325 MG tablet Take 650 mg by mouth every 6 (six) hours as needed for moderate pain.    [provider]  ibuprofen  (ADVIL ) 200 MG tablet Take 2 tablets (400 mg total) by mouth every 8 (eight) hours as needed for moderate pain. 09/10/22   Shahmehdi, Adriana LABOR, MD  nicotine  (NICODERM CQ  - DOSED IN MG/24 HOURS) 21 mg/24hr patch Place 1 patch (21 mg total) onto the skin daily. 09/11/22   Willette Adriana LABOR, MD    Physical Exam: Vitals:   01/15/24 1115 01/15/24 1118 01/15/24 1527  BP:  126/84 139/84  Pulse:  75 60  Resp:   20  Temp:  98 F (36.7 C) 97.8 F (36.6 C)  TempSrc:   Oral  SpO2:  99% 97%  Weight: 70.8 kg    Height: 5' 11 (1.803 m)      Constitutional: NAD, calm, comfortable Vitals:   01/15/24 1115 01/15/24 1118 01/15/24 1527  BP:  126/84 139/84  Pulse:  75 60  Resp:   20  Temp:  98 F (36.7 C) 97.8 F (36.6 C)  TempSrc:   Oral  SpO2:  99% 97%  Weight: 70.8 kg    Height: 5' 11 (1.803 m)  Eyes: PERRL, lids and conjunctivae normal ENMT: Mucous membranes are moist. Posterior pharynx clear of any exudate or lesions.Normal dentition.  Neck: normal, supple, no masses, no thyromegaly Respiratory: clear to auscultation bilaterally, no wheezing, no crackles. Normal respiratory effort. No accessory muscle use.  Cardiovascular: Regular rate and rhythm, no murmurs / rubs / gallops. No extremity edema.  Extremities warm. Abdomen: Minimal tenderness lower abdomen suprapubic area, soft, no masses palpated. Musculoskeletal: no clubbing / cyanosis. No joint deformity upper and lower extremities.   Skin: no rashes, lesions, ulcers. No induration Neurologic: No facial  asymmetry, moving extremities spontaneously, speech fluent. Psychiatric: Normal judgment and insight. Alert and oriented x 3. Normal mood.   Labs on Admission: I have personally reviewed following labs and imaging studies  CBC: Recent Labs  Lab 01/15/24 1200  WBC 11.8*  NEUTROABS 8.0*  HGB 15.0  HCT 45.2  MCV 88.6  PLT 229   Basic Metabolic Panel: Recent Labs  Lab 01/15/24 1200  NA 135  K 4.3  CL 102  CO2 23  GLUCOSE 97  BUN 18  CREATININE 0.85  CALCIUM 9.1   GFR: Estimated Creatinine Clearance: 90.2 mL/min (by C-G formula based on SCr of 0.85 mg/dL). Liver Function Tests: Recent Labs  Lab 01/15/24 1200  AST 14*  ALT 13  ALKPHOS 59  BILITOT 0.6  PROT 7.3  ALBUMIN 3.7   Recent Labs  Lab 01/15/24 1200  LIPASE 30   Urine analysis:    Component Value Date/Time   COLORURINE YELLOW 01/15/2024 1230   APPEARANCEUR CLEAR 01/15/2024 1230   LABSPEC 1.014 01/15/2024 1230   PHURINE 7.0 01/15/2024 1230   GLUCOSEU NEGATIVE 01/15/2024 1230   HGBUR NEGATIVE 01/15/2024 1230   BILIRUBINUR NEGATIVE 01/15/2024 1230   BILIRUBINUR negative 09/07/2022 1922   KETONESUR NEGATIVE 01/15/2024 1230   PROTEINUR NEGATIVE 01/15/2024 1230   UROBILINOGEN 0.2 09/07/2022 1922   UROBILINOGEN 0.2 01/22/2011 0610   NITRITE NEGATIVE 01/15/2024 1230   LEUKOCYTESUR NEGATIVE 01/15/2024 1230    Radiological Exams on Admission: CT ABDOMEN PELVIS W CONTRAST Result Date: 01/15/2024 CLINICAL DATA:  Suprapubic abdominal pain. History of diverticulitis and oliguria. EXAM: CT ABDOMEN AND PELVIS WITH CONTRAST TECHNIQUE: Multidetector CT imaging of the abdomen and pelvis was performed using the standard protocol following bolus administration of intravenous contrast. RADIATION DOSE REDUCTION: This exam was performed according to the departmental dose-optimization program which includes automated exposure control, adjustment of the mA and/or kV according to patient size and/or use of iterative  reconstruction technique. CONTRAST:  OMNIPAQUE  IOHEXOL  300 MG/ML  SOLN COMPARISON:  09/25/2022 FINDINGS: Lower chest: Mild scarring in the posterior basal segments of both lower lobes. Hepatobiliary: Unremarkable Pancreas: Mild prominence of dorsal pancreatic duct measuring up to 4 mm in diameter in the pancreatic head, previously 3 mm. A specific cause is not visually apparent. Spleen: Unremarkable Adrenals/Urinary Tract: The kidneys and adrenal glands appear unremarkable. Wall thickening of the urinary bladder along the left lateral roof adjacent to a diverticular abscess. No gas in the urinary bladder lumen is identified. Stomach/Bowel: Sigmoid colon diverticulosis along with distal sigmoid colon diverticulitis including a paracolic and presumed diverticular abscess along the lower margin of a potential fistulous tract from the sigmoid colon, this abscess measures about 3.3 by 2.7 by 3.6 cm and sits just above the left bladder with secondary thickening of the left bladder roof. Vascular/Lymphatic: Atherosclerosis is present, including aortoiliac atherosclerotic disease. Calcified right inguinal lymph node 1.1 cm in short axis on image 84 series 2, stable.  Reproductive: Unremarkable Other: No supplemental non-categorized findings. 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 Degenerative disc disease with loss of disc height noted at L4-5 and L5-S1. There is also a disc bulge at the L3-4 level. IMPRESSION: 1. Distal sigmoid colon diverticulitis with a diverticular abscess along the lower margin of a potential fistulous tract from the sigmoid colon, this abscess measures about 3.3 by 2.7 by 3.6 cm and sits just above the left bladder roof with secondary thickening of the left bladder roof. No gas in  the urinary bladder lumen is identified to indicate connectivity with the urinary bladder lumen. 2. Mild prominence of dorsal pancreatic duct measuring up to 4 mm in diameter in the pancreatic head, previously 3 mm. A specific cause is not visually apparent and there is no biliary dilatation. Further workup with pancreatic protocol MRI with and without contrast is considered optional and should be primarily based on any history of pancreatic symptoms. 3. 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 4. Degenerative disc disease at L4-5 and L5-S1. 5.  Aortic Atherosclerosis (ICD10-I70.0). Electronically Signed   By: Ryan Salvage M.D.   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 Rules out for sepsis.  Afebrile.  WBC 11.8.   - CT shows distal sigmoid colon diverticulitis with a diverticular abscess - 3.3 by 2.7 by 3.6 cm.  Potential fistulous track. No gas in the urinary bladder lumen is identified to indicate connectivity with the urinary bladder lumen. -Hospitalized 09/2022 for same but that abscess was larger 6.3 x 7.1 x 5.5, requiring drain placement. -Seen by Dr. Mavis today, plan for IV antibiotics, IR consult. - Continue IV Zosyn  - Clear liquid diet, n.p.o. midnight - 1 Liter bolus given, continue D5 N/s 75cc/hr x 1 day - IV morphine  4 mg as needed   DVT prophylaxis:  Lovenox  Code Status: Full code Family Communication: none at bedside Disposition Plan:  > 2 days Consults called: Gen Surg, IR Admission status: Inpt Med surg I certify that at the point of admission it is my clinical judgment that the patient will require inpatient hospital care  spanning beyond 2 midnights from the point of admission due to high intensity of service, high risk for further deterioration and high frequency of surveillance required.    Author: Tully FORBES Carwin, MD 01/15/2024 4:41 PM  For on call review [email or web address].

## 2024-01-15 NOTE — TOC CM/SW Note (Signed)
 Transition of Care [facility]) - Inpatient Brief Assessment   Patient Details  Name: James Blake MRN: [id number] Date of Birth: 26-Aug-1960  Transition of Care [facility]) CM/SW Contact:    Noreen KATHEE Pinal, LCSWA Phone Number: 01/15/2024, 3:13 PM   Clinical Narrative:  Transition of Care Department [facility]) has reviewed patient and no TOC needs have been identified at this time. We will continue to monitor patient advancement through interdisciplinary progression rounds. If new patient transition needs arise, please place a TOC consult.  Transition of Care Asessment: Insurance and Status: Insurance coverage has been reviewed Patient has primary care physician: Yes Home environment has been reviewed: Single Family Home Prior level of function:: Independent Prior/Current Home Services: No current home services Social Drivers of Health Review: SDOH reviewed no interventions necessary Readmission risk has been reviewed: Yes Transition of care needs: no transition of care needs at this time

## 2024-01-15 NOTE — ED Triage Notes (Signed)
 Pt arrived via POV mid lower abdominal pain for the past few weeks. Pt reports Hx of Diverticulitis. Pt also reports Oliguria. Pt reports last BM was yesterday.

## 2024-01-15 NOTE — Assessment & Plan Note (Addendum)
 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 Rules out for sepsis.  Afebrile.  WBC 11.8.   - CT shows distal sigmoid colon diverticulitis with a diverticular abscess - 3.3 by 2.7 by 3.6 cm.  Potential fistulous track. No gas in the urinary bladder lumen is identified to indicate connectivity with the urinary bladder lumen. -Hospitalized 09/2022 for same but that abscess was larger 6.3 x 7.1 x 5.5, requiring drain placement. -Seen by Dr. Mavis today, plan for IV antibiotics, IR consult. - Continue IV Zosyn  - Clear liquid diet, n.p.o. midnight - 1 Liter bolus given, continue D5 N/s 75cc/hr x 1 day - IV morphine  4 mg as needed

## 2024-01-15 NOTE — Consult Note (Signed)
 Reason for Consult: Abdominal Pain Referring Physician: Ozell Arts, MD  HPI: James Blake is an 63 y.o. male with diverticulitis , TUD, and remote history of cocaine use presenting today for a one day history of worsening sharp and stabbing midline abdominal pain that doesn't radiate. He has been having difficulty and some mild discomfort with urination over the past two months but this did not become extremely painful until yesterday evening. He had difficulty sleeping because of the pain and it is 10/10 at worst which brought him in to the ED. Nothing makes it better or worse and it comes on sporadically. He has had an episode of diverticulitis complicated by diverticular abscess requiring drainage a little over a year ago.   Past Medical History:  Diagnosis Date   Diverticulitis    Substance abuse [facility])     Past Surgical History:  Procedure Laterality Date   DENTAL SURGERY     HERNIA REPAIR      Family History  Problem Relation Age of Onset   Diabetes Father    Heart failure Father     Social History:  reports that he has been smoking cigarettes. He has a 80 pack-year smoking history. He has been exposed to tobacco smoke. He has never used smokeless tobacco. He reports that he does not currently use alcohol. He reports current drug use. Frequency: 7.00 times per week. Drug: Cocaine.  Allergies: No Known Allergies  Medications: I have reviewed the patient's current medications. Prior to Admission: (Not in a hospital admission)   Results for orders placed or performed during the hospital encounter of 01/15/24 (from the past 48 hours)  Lipase, blood     Status: None   Collection Time: 01/15/24 12:00 PM  Result Value Ref Range   Lipase 30 11 - 51 U/L    Comment: Performed at [facility], [address] [place], [place] [address]  CBC with Differential     Status: Abnormal   Collection Time: 01/15/24 12:00 PM  Result Value Ref Range   WBC 11.8 (H) 4.0 - 10.5 K/uL    RBC 5.10 4.22 - 5.81 MIL/uL   Hemoglobin 15.0 13.0 - 17.0 g/dL   HCT 54.7 60.9 - 47.9 %   MCV 88.6 80.0 - 100.0 fL   MCH 29.4 26.0 - 34.0 pg   MCHC 33.2 30.0 - 36.0 g/dL   RDW 86.0 88.4 - 84.4 %   Platelets 229 150 - 400 K/uL   nRBC 0.0 0.0 - 0.2 %   Neutrophils Relative % 68 %   Neutro Abs 8.0 (H) 1.7 - 7.7 K/uL   Lymphocytes Relative 23 %   Lymphs Abs 2.7 0.7 - 4.0 K/uL   Monocytes Relative 8 %   Monocytes Absolute 0.9 0.1 - 1.0 K/uL   Eosinophils Relative 1 %   Eosinophils Absolute 0.1 0.0 - 0.5 K/uL   Basophils Relative 0 %   Basophils Absolute 0.1 0.0 - 0.1 K/uL   Immature Granulocytes 0 %   Abs Immature Granulocytes 0.03 0.00 - 0.07 K/uL    Comment: Performed at [facility], [address] [place], [place] [address]  Comprehensive metabolic panel     Status: Abnormal   Collection Time: 01/15/24 12:00 PM  Result Value Ref Range   Sodium 135 135 - 145 mmol/L   Potassium 4.3 3.5 - 5.1 mmol/L   Chloride 102 98 - 111 mmol/L   CO2 23 22 - 32 mmol/L   Glucose, Bld 97 70 - 99  mg/dL    Comment: Glucose reference range applies only to samples taken after fasting for at least 8 hours.   BUN 18 8 - 23 mg/dL   Creatinine, Ser 9.14 0.61 - 1.24 mg/dL   Calcium 9.1 8.9 - 89.6 mg/dL   Total Protein 7.3 6.5 - 8.1 g/dL   Albumin 3.7 3.5 - 5.0 g/dL   AST 14 (L) 15 - 41 U/L   ALT 13 0 - 44 U/L   Alkaline Phosphatase 59 38 - 126 U/L   Total Bilirubin 0.6 0.0 - 1.2 mg/dL   GFR, Estimated >39 >39 mL/min    Comment: (NOTE) Calculated using the CKD-EPI Creatinine Equation (2021)    Anion gap 10 5 - 15    Comment: Performed at [facility], [address] [place], [place] [address]  Urinalysis, Routine w reflex microscopic -Urine, Clean Catch     Status: None   Collection Time: 01/15/24 12:30 PM  Result Value Ref Range   Color, Urine YELLOW YELLOW   APPearance CLEAR CLEAR   Specific Gravity, Urine 1.014 1.005 - 1.030   pH 7.0 5.0 - 8.0   Glucose, UA NEGATIVE NEGATIVE mg/dL   Hgb  urine dipstick NEGATIVE NEGATIVE   Bilirubin Urine NEGATIVE NEGATIVE   Ketones, ur NEGATIVE NEGATIVE mg/dL   Protein, ur NEGATIVE NEGATIVE mg/dL   Nitrite NEGATIVE NEGATIVE   Leukocytes,Ua NEGATIVE NEGATIVE    Comment: Performed at [facility], [address], [place], [place] [address]    CT ABDOMEN PELVIS W CONTRAST Result Date: 01/15/2024 CLINICAL DATA:  Suprapubic abdominal pain. History of diverticulitis and oliguria. EXAM: CT ABDOMEN AND PELVIS WITH CONTRAST TECHNIQUE: Multidetector CT imaging of the abdomen and pelvis was performed using the standard protocol following bolus administration of intravenous contrast. RADIATION DOSE REDUCTION: This exam was performed according to the departmental dose-optimization program which includes automated exposure control, adjustment of the mA and/or kV according to patient size and/or use of iterative reconstruction technique. CONTRAST:  OMNIPAQUE  IOHEXOL  300 MG/ML  SOLN COMPARISON:  09/25/2022 FINDINGS: Lower chest: Mild scarring in the posterior basal segments of both lower lobes. Hepatobiliary: Unremarkable Pancreas: Mild prominence of dorsal pancreatic duct measuring up to 4 mm in diameter in the pancreatic head, previously 3 mm. A specific cause is not visually apparent. Spleen: Unremarkable Adrenals/Urinary Tract: The kidneys and adrenal glands appear unremarkable. Wall thickening of the urinary bladder along the left lateral roof adjacent to a diverticular abscess. No gas in the urinary bladder lumen is identified. Stomach/Bowel: Sigmoid colon diverticulosis along with distal sigmoid colon diverticulitis including a paracolic and presumed diverticular abscess along the lower margin of a potential fistulous tract from the sigmoid colon, this abscess measures about 3.3 by 2.7 by 3.6 cm and sits just above the left bladder with secondary thickening of the left bladder roof. Vascular/Lymphatic: Atherosclerosis is present, including aortoiliac  atherosclerotic disease. Calcified right inguinal lymph node 1.1 cm in short axis on image 84 series 2, stable. Reproductive: Unremarkable Other: No supplemental non-categorized findings. 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 Degenerative disc disease with loss of disc height noted at L4-5 and L5-S1. There is also a disc bulge at the L3-4 level. IMPRESSION: 1. Distal sigmoid colon diverticulitis with a diverticular abscess along the lower margin of a potential fistulous tract  from the sigmoid colon, this abscess measures about 3.3 by 2.7 by 3.6 cm and sits just above the left bladder roof with secondary thickening of the left bladder roof. No gas in the urinary bladder lumen is identified to indicate connectivity with the urinary bladder lumen. 2. Mild prominence of dorsal pancreatic duct measuring up to 4 mm in diameter in the pancreatic head, previously 3 mm. A specific cause is not visually apparent and there is no biliary dilatation. Further workup with pancreatic protocol MRI with and without contrast is considered optional and should be primarily based on any history of pancreatic symptoms. 3. 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 4. Degenerative disc disease at L4-5 and L5-S1. 5.  Aortic Atherosclerosis (ICD10-I70.0). Electronically Signed   By: Ryan Salvage M.D.   On: 01/15/2024 13:56    ROS:  Pertinent items are noted in HPI.  Blood pressure 126/84, pulse 75, temperature 98 F (36.7 C), height 5' 11 (1.803 m), weight 70.8 kg, SpO2 99%. 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 Patient's abscess is similar in condition to the one he had drained a year ago. Patient was at that point lost to follow up. No need for acute surgical intervention at this time. Plan: Consult IR for percutaneous drainage. Would admit for several days of IV abx, will follow with you.  James Blake 01/15/2024, 3:02 PM

## 2024-01-15 NOTE — ED Provider Notes (Signed)
 [place] EMERGENCY DEPARTMENT AT [facility] Provider Note   CSN: [id number] Arrival date & time: 01/15/24  1102     Patient presents with: No chief complaint on file.   James Blake is a 63 y.o. male.   Patient is a 63 year old male who presents emergency department the chief complaint of lower abdominal pain and difficulty with urinating which has been ongoing for the past week.  Patient notes he does have a history of diverticulitis and notes that this feels similar to previous.  He notes his last bowel movement was yesterday.  He denies any associated dysuria or hematuria.  There is been no associated fever or chills.  He denies any recent falls or blunt abdominal wall trauma.  Of note patient does have a history of a diverticular abscess.        Prior to Admission medications   Medication Sig Start Date End Date Taking? Authorizing Provider  acetaminophen  (TYLENOL ) 325 MG tablet Take 650 mg by mouth every 6 (six) hours as needed for moderate pain.    [provider]  ibuprofen  (ADVIL ) 200 MG tablet Take 2 tablets (400 mg total) by mouth every 8 (eight) hours as needed for moderate pain. 09/10/22   Shahmehdi, Adriana LABOR, MD  nicotine  (NICODERM CQ  - DOSED IN MG/24 HOURS) 21 mg/24hr patch Place 1 patch (21 mg total) onto the skin daily. 09/11/22   Willette Adriana LABOR, MD    Allergies: Patient has no known allergies.    Review of Systems  Gastrointestinal:  Positive for abdominal pain.    Updated Vital Signs BP 139/84 (BP Location: Left Arm)   Pulse 60   Temp 97.8 F (36.6 C) (Oral)   Resp 20   Ht 5' 11 (1.803 m)   Wt 70.8 kg   SpO2 97%   BMI 21.76 kg/m   Physical Exam Vitals and nursing note reviewed.  Constitutional:      Appearance: Normal appearance.  HENT:     Head: Normocephalic and atraumatic.     Nose: Nose normal.     Mouth/Throat:     Mouth: Mucous membranes are moist.  Eyes:     Extraocular Movements: Extraocular movements intact.      Conjunctiva/sclera: Conjunctivae normal.     Pupils: Pupils are equal, round, and reactive to light.  Cardiovascular:     Rate and Rhythm: Normal rate and regular rhythm.     Pulses: Normal pulses.     Heart sounds: Normal heart sounds. No murmur heard.    No gallop.  Pulmonary:     Effort: Pulmonary effort is normal. No respiratory distress.     Breath sounds: Normal breath sounds. No stridor. No wheezing, rhonchi or rales.  Abdominal:     General: Abdomen is flat. Bowel sounds are normal. There is no distension.     Palpations: Abdomen is soft.     Tenderness: There is no guarding.     Comments: Tenderness palpation to suprapubic region  Musculoskeletal:        General: Normal range of motion.     Cervical back: Normal range of motion and neck supple.  Skin:    General: Skin is warm and dry.  Neurological:     General: No focal deficit present.     Mental Status: He is alert and oriented to person, place, and time. Mental status is at baseline.  Psychiatric:        Mood and Affect: Mood normal.  Behavior: Behavior normal.        Thought Content: Thought content normal.        Judgment: Judgment normal.     (all labs ordered are listed, but only abnormal results are displayed) Labs Reviewed  CBC WITH DIFFERENTIAL/PLATELET - Abnormal; Notable for the following components:      Result Value   WBC 11.8 (*)    Neutro Abs 8.0 (*)    All other components within normal limits  COMPREHENSIVE METABOLIC PANEL WITH GFR - Abnormal; Notable for the following components:   AST 14 (*)    All other components within normal limits  URINALYSIS, ROUTINE W REFLEX MICROSCOPIC  LIPASE, BLOOD    EKG: None  Radiology: CT ABDOMEN PELVIS W CONTRAST Result Date: 01/15/2024 CLINICAL DATA:  Suprapubic abdominal pain. History of diverticulitis and oliguria. EXAM: CT ABDOMEN AND PELVIS WITH CONTRAST TECHNIQUE: Multidetector CT imaging of the abdomen and pelvis was performed using the  standard protocol following bolus administration of intravenous contrast. RADIATION DOSE REDUCTION: This exam was performed according to the departmental dose-optimization program which includes automated exposure control, adjustment of the mA and/or kV according to patient size and/or use of iterative reconstruction technique. CONTRAST:  OMNIPAQUE  IOHEXOL  300 MG/ML  SOLN COMPARISON:  09/25/2022 FINDINGS: Lower chest: Mild scarring in the posterior basal segments of both lower lobes. Hepatobiliary: Unremarkable Pancreas: Mild prominence of dorsal pancreatic duct measuring up to 4 mm in diameter in the pancreatic head, previously 3 mm. A specific cause is not visually apparent. Spleen: Unremarkable Adrenals/Urinary Tract: The kidneys and adrenal glands appear unremarkable. Wall thickening of the urinary bladder along the left lateral roof adjacent to a diverticular abscess. No gas in the urinary bladder lumen is identified. Stomach/Bowel: Sigmoid colon diverticulosis along with distal sigmoid colon diverticulitis including a paracolic and presumed diverticular abscess along the lower margin of a potential fistulous tract from the sigmoid colon, this abscess measures about 3.3 by 2.7 by 3.6 cm and sits just above the left bladder with secondary thickening of the left bladder roof. Vascular/Lymphatic: Atherosclerosis is present, including aortoiliac atherosclerotic disease. Calcified right inguinal lymph node 1.1 cm in short axis on image 84 series 2, stable. Reproductive: Unremarkable Other: No supplemental non-categorized findings. 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 Degenerative disc disease with loss of disc height noted at L4-5 and L5-S1. There is also a disc bulge at the L3-4 level.  IMPRESSION: 1. Distal sigmoid colon diverticulitis with a diverticular abscess along the lower margin of a potential fistulous tract from the sigmoid colon, this abscess measures about 3.3 by 2.7 by 3.6 cm and sits just above the left bladder roof with secondary thickening of the left bladder roof. No gas in the urinary bladder lumen is identified to indicate connectivity with the urinary bladder lumen. 2. Mild prominence of dorsal pancreatic duct measuring up to 4 mm in diameter in the pancreatic head, previously 3 mm. A specific cause is not visually apparent and there is no biliary dilatation. Further workup with pancreatic protocol MRI with and without contrast is considered optional and should be primarily based on any history of pancreatic symptoms. 3. 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 4. Degenerative disc disease at L4-5 and L5-S1. 5.  Aortic Atherosclerosis (ICD10-I70.0). Electronically Signed   By: Ryan Salvage M.D.   On: 01/15/2024 13:56     Procedures   Medications Ordered in the ED  sodium chloride  0.9 % bolus 1,000 mL (0 mLs Intravenous Stopped 01/15/24 1526)  morphine  (PF) 4 MG/ML injection 4 mg (4 mg Intravenous Given 01/15/24 1312)  ondansetron  (ZOFRAN ) injection 4 mg (4 mg Intravenous Given 01/15/24 1312)  iohexol  (OMNIPAQUE ) 300 MG/ML solution 100 mL (100 mLs Intravenous Contrast Given 01/15/24 1322)  piperacillin -tazobactam (ZOSYN ) IVPB 3.375 g (0 g Intravenous Stopped 01/15/24 1526)                                    Medical Decision Making Amount and/or Complexity of Data Reviewed Labs: ordered. Radiology: ordered.  Risk Prescription drug management. Decision regarding hospitalization.   This patient presents to the ED for concern of abdominal pain, this involves an extensive number of  treatment options, and is a complaint that carries with it a high risk of complications and morbidity.  The differential diagnosis includes acute appendicitis, cholecystitis, bowel torsion, diverticulitis, testicular torsion, pyelonephritis, kidney stone, pancreatitis, mesenteric ischemia, diverticular abscess, urinary retention   Co morbidities that complicate the patient evaluation  Previous diverticulitis with diverticular abscess   Additional history obtained:  Additional history obtained from medical records External records from outside source obtained and reviewed including medical records   Lab Tests:  I Ordered, and personally interpreted labs.  The pertinent results include: Mild leukocytosis, no anemia, normal kidney function liver function, normal electrolytes, normal lipase, unremarkable urinalysis   Imaging Studies ordered:  I ordered imaging studies including CT scan of the abdomen pelvis I independently visualized and interpreted imaging which showed acute diverticulitis with diverticular abscess I agree with the radiologist interpretation    Consultations Obtained:  I requested consultation with the general surgery, Dr. Mavis,  and discussed lab and imaging findings as well as pertinent plan - they recommend: Admission to the hospital, IV antibiotics, IR consultation   Problem List / ED Course / Critical interventions / Medication management  Patient is doing well at this time and does remain stable.  Discussed with patient CT scan findings are consistent with a diverticular abscess.  Patient does not actively meet sepsis criteria at this point is otherwise well-appearing.  He has been given a dose of Zosyn  as well as IV fluids.  Urinalysis was overall unremarkable.  Do suspect that his difficulty with urination is secondary to the diverticular abscess pushing on his bladder.  Patient was evaluated by Dr. Mavis with general surgery who did recommend admission to  the hospital service, IV antibiotics as well as consultation with IR.  Did discuss patient case with Dr. FORBES Carwin who has excepted for admission at this time. I ordered medication including Zosyn , morphine , Zofran , IV fluids  for diverticulitis abscess Reevaluation of the patient after these medicines showed that the patient improved I have reviewed the patients home medicines and have made adjustments as needed   Social Determinants of Health:  None   Test / Admission - Considered:  Admission     Final diagnoses:  Colonic diverticular abscess    ED Discharge Orders     None          Daralene Lonni BIRCH, PA-C 01/15/24 1544  Towana Ozell BROCKS, MD 01/15/24 [phone]

## 2024-01-16 DIAGNOSIS — K5720 Diverticulitis of large intestine with perforation and abscess without bleeding: Secondary | ICD-10-CM

## 2024-01-16 LAB — CBC
HCT: 42.5 % (ref 39.0–52.0)
Hemoglobin: 14.2 g/dL (ref 13.0–17.0)
MCH: 29.8 pg (ref 26.0–34.0)
MCHC: 33.4 g/dL (ref 30.0–36.0)
MCV: 89.1 fL (ref 80.0–100.0)
Platelets: 231 K/uL (ref 150–400)
RBC: 4.77 MIL/uL (ref 4.22–5.81)
RDW: 14 % (ref 11.5–15.5)
WBC: 10.8 K/uL — ABNORMAL HIGH (ref 4.0–10.5)
nRBC: 0 % (ref 0.0–0.2)

## 2024-01-16 LAB — BASIC METABOLIC PANEL WITH GFR
Anion gap: 12 (ref 5–15)
BUN: 17 mg/dL (ref 8–23)
CO2: 26 mmol/L (ref 22–32)
Calcium: 8.9 mg/dL (ref 8.9–10.3)
Chloride: 101 mmol/L (ref 98–111)
Creatinine, Ser: 0.88 mg/dL (ref 0.61–1.24)
GFR, Estimated: >60 mL/min (ref >60)
Glucose, Bld: 105 mg/dL — ABNORMAL HIGH (ref 70–99)
Potassium: 4.2 mmol/L (ref 3.5–5.1)
Sodium: 139 mmol/L (ref 135–145)

## 2024-01-16 LAB — HIV ANTIBODY (ROUTINE TESTING W REFLEX): HIV Screen 4th Generation wRfx: NONREACTIVE

## 2024-01-16 NOTE — Progress Notes (Signed)
 PROGRESS NOTE    James Blake  FMW:[id number] DOB: [birth year] DOA: 01/15/2024 PCP: Marvine Rush, MD   Brief Narrative:    James Blake is a 63 y.o. male with medical history significant for tobacco abuse, diverticulitis.  Patient presented to the ED with complaints of mid lower abdominal pain over the past few weeks.  No vomiting or diarrhea.  Patient was admitted for colonic diverticular abscess and has been seen by general surgery with plans for IR drainage of the abscess by 7/9.  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 Rules out for sepsis.  Afebrile.  WBC 11.8.   - CT shows distal sigmoid colon diverticulitis with a diverticular abscess - 3.3 by 2.7 by 3.6 cm.  Potential fistulous track. No gas in the urinary bladder lumen is identified to indicate connectivity with the urinary bladder lumen. -Hospitalized 09/2022 for same but that abscess was larger 6.3 x 7.1 x 5.5, requiring drain placement. - Appreciate ongoing general surgery evaluation.  Plan to follow-up after drain removal for consideration of sigmoid colectomy -Discussed with IR with plans for drainage on 7/9 - Continue IV Zosyn  - Regular diet and n.p.o. after midnight - 1 Liter bolus given, continue D5 N/s 75cc/hr x 1 day - IV morphine  4 mg as needed   DVT prophylaxis: Lovenox  Code Status: Full Family Communication: None at bedside Disposition Plan:  Status is: Inpatient Remains inpatient appropriate because: Need for IV antibiotics.   Consultants:  General Surgery IR  Procedures:  None  Antimicrobials:  Anti-infectives (From admission, onward)    Start     Dose/Rate Route Frequency Ordered Stop   01/15/24 2315  piperacillin -tazobactam (ZOSYN ) IVPB 3.375 g        3.375 g 12.5 mL/hr over 240 Minutes Intravenous Every 8 hours 01/15/24 2308     01/15/24 1415   piperacillin -tazobactam (ZOSYN ) IVPB 3.375 g        3.375 g 100 mL/hr over 30 Minutes Intravenous  Once 01/15/24 1400 01/15/24 1526       Subjective: Patient seen and evaluated today with complaints of minimal abdominal pain this morning.  Objective: Vitals:   01/15/24 1648 01/15/24 2108 01/16/24 0109 01/16/24 0359  BP: 111/77 132/77 130/78 121/75  Pulse: 87 65 71 64  Resp: 18 16 14 14   Temp: 97.9 F (36.6 C) 98.4 F (36.9 C) 98.3 F (36.8 C) 97.7 F (36.5 C)  TempSrc: Oral Oral Oral Oral  SpO2: 98% 99% 97% 98%  Weight: 72 kg     Height: 5' 11 (1.803 m)      No intake or output data in the 24 hours ending 01/16/24 1201 Filed Weights   01/15/24 1115 01/15/24 1648  Weight: 70.8 kg 72 kg    Examination:  General exam: Appears calm and comfortable  Respiratory system: Clear to auscultation. Respiratory effort normal. Cardiovascular system: S1 & S2 heard, RRR.  Gastrointestinal system: Abdomen is soft Central nervous system: Alert and awake Extremities: No edema Skin: No significant lesions noted Psychiatry: Flat affect.    Data Reviewed: I have personally reviewed following labs and imaging studies  CBC: Recent Labs  Lab 01/15/24 1200 01/16/24 0440  WBC 11.8* 10.8*  NEUTROABS 8.0*  --   HGB 15.0 14.2  HCT 45.2 42.5  MCV 88.6 89.1  PLT 229 231   Basic Metabolic Panel: Recent Labs  Lab 01/15/24 1200 01/16/24 0440  NA 135 139  K 4.3 4.2  CL 102 101  CO2 23 26  GLUCOSE 97 105*  BUN 18 17  CREATININE 0.85 0.88  CALCIUM 9.1 8.9   GFR: Estimated Creatinine Clearance: 88.6 mL/min (by C-G formula based on SCr of 0.88 mg/dL). Liver Function Tests: Recent Labs  Lab 01/15/24 1200  AST 14*  ALT 13  ALKPHOS 59  BILITOT 0.6  PROT 7.3  ALBUMIN 3.7   Recent Labs  Lab 01/15/24 1200  LIPASE 30   No results for input(s): AMMONIA in the last 168 hours. Coagulation Profile: No results for input(s): INR, PROTIME in the last 168 hours. Cardiac  Enzymes: No results for input(s): CKTOTAL, CKMB, CKMBINDEX, TROPONINI in the last 168 hours. BNP (last 3 results) No results for input(s): PROBNP in the last 8760 hours. HbA1C: No results for input(s): HGBA1C in the last 72 hours. CBG: No results for input(s): GLUCAP in the last 168 hours. Lipid Profile: No results for input(s): CHOL, HDL, LDLCALC, TRIG, CHOLHDL, LDLDIRECT in the last 72 hours. Thyroid Function Tests: No results for input(s): TSH, T4TOTAL, FREET4, T3FREE, THYROIDAB in the last 72 hours. Anemia Panel: No results for input(s): VITAMINB12, FOLATE, FERRITIN, TIBC, IRON, RETICCTPCT in the last 72 hours. Sepsis Labs: No results for input(s): PROCALCITON, LATICACIDVEN in the last 168 hours.  No results found for this or any previous visit (from the past 240 hours).       Radiology Studies: CT ABDOMEN PELVIS W CONTRAST Result Date: 01/15/2024 CLINICAL DATA:  Suprapubic abdominal pain. History of diverticulitis and oliguria. EXAM: CT ABDOMEN AND PELVIS WITH CONTRAST TECHNIQUE: Multidetector CT imaging of the abdomen and pelvis was performed using the standard protocol following bolus administration of intravenous contrast. RADIATION DOSE REDUCTION: This exam was performed according to the departmental dose-optimization program which includes automated exposure control, adjustment of the mA and/or kV according to patient size and/or use of iterative reconstruction technique. CONTRAST:  OMNIPAQUE  IOHEXOL  300 MG/ML  SOLN COMPARISON:  09/25/2022 FINDINGS: Lower chest: Mild scarring in the posterior basal segments of both lower lobes. Hepatobiliary: Unremarkable Pancreas: Mild prominence of dorsal pancreatic duct measuring up to 4 mm in diameter in the pancreatic head, previously 3 mm. A specific cause is not visually apparent. Spleen: Unremarkable Adrenals/Urinary Tract: The kidneys and adrenal glands appear unremarkable. Wall  thickening of the urinary bladder along the left lateral roof adjacent to a diverticular abscess. No gas in the urinary bladder lumen is identified. Stomach/Bowel: Sigmoid colon diverticulosis along with distal sigmoid colon diverticulitis including a paracolic and presumed diverticular abscess along the lower margin of a potential fistulous tract from the sigmoid colon, this abscess measures about 3.3 by 2.7 by 3.6 cm and sits just above the left bladder with secondary thickening of the left bladder roof. Vascular/Lymphatic: Atherosclerosis is present, including aortoiliac atherosclerotic disease. Calcified right inguinal lymph node 1.1 cm in short axis on image 84 series 2, stable. Reproductive: Unremarkable Other: No supplemental non-categorized findings. 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 Degenerative disc disease with loss of disc height noted at L4-5 and L5-S1. There is also a disc bulge at the L3-4 level. IMPRESSION: 1. Distal sigmoid colon diverticulitis with a diverticular abscess along the lower margin of a potential fistulous tract from the sigmoid colon, this abscess measures  about 3.3 by 2.7 by 3.6 cm and sits just above the left bladder roof with secondary thickening of the left bladder roof. No gas in the urinary bladder lumen is identified to indicate connectivity with the urinary bladder lumen. 2. Mild prominence of dorsal pancreatic duct measuring up to 4 mm in diameter in the pancreatic head, previously 3 mm. A specific cause is not visually apparent and there is no biliary dilatation. Further workup with pancreatic protocol MRI with and without contrast is considered optional and should be primarily based on any history of pancreatic symptoms. 3. 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 4. Degenerative disc disease at L4-5 and L5-S1. 5.  Aortic Atherosclerosis (ICD10-I70.0). Electronically Signed   By: Ryan Salvage M.D.   On: 01/15/2024 13:56        Scheduled Meds:   Continuous Infusions:  piperacillin -tazobactam (ZOSYN )  IV 3.375 g (01/16/24 0527)     LOS: 1 day    Time spent: 55 minutes    Obrien Huskins D Maree, DO [facility]  If 7PM-7AM, please contact night-coverage [email or web address] 01/16/2024, 12:01 PM

## 2024-01-16 NOTE — Plan of Care (Signed)
  Problem: Education: Goal: Knowledge of General Education information will improve Description: Including pain rating scale, medication(s)/side effects and non-pharmacologic comfort measures Outcome: Progressing   Problem: Health Behavior/Discharge Planning: Goal: Ability to manage health-related needs will improve Outcome: Progressing   Problem: Clinical Measurements: Goal: Diagnostic test results will improve Outcome: Progressing   Problem: Activity: Goal: Risk for activity intolerance will decrease Outcome: Progressing   Problem: Clinical Measurements: Goal: Respiratory complications will improve Outcome: Adequate for Discharge Goal: Cardiovascular complication will be avoided Outcome: Adequate for Discharge

## 2024-01-16 NOTE — Plan of Care (Signed)

## 2024-01-16 NOTE — Plan of Care (Signed)
   Problem: Clinical Measurements: Goal: Ability to maintain clinical measurements within normal limits will improve Outcome: Progressing

## 2024-01-16 NOTE — Progress Notes (Signed)
 Subjective: Patient has had some relief of his pain.  Still has some pressure sensation when voiding.  Objective: Vital signs in last 24 hours: Temp:  [97.7 F (36.5 C)-98.4 F (36.9 C)] 97.7 F (36.5 C) (07/08 0359) Pulse Rate:  [60-87] 64 (07/08 0359) Resp:  [14-20] 14 (07/08 0359) BP: (111-139)/(75-84) 121/75 (07/08 0359) SpO2:  [95 %-99 %] 98 % (07/08 0359) Weight:  [70.8 kg-72 kg] 72 kg (07/07 1648) Last BM Date : 01/15/24  Intake/Output from previous day: No intake/output data recorded. Intake/Output this shift: No intake/output data recorded.  General appearance: alert, cooperative, and no distress GI: Soft with some tenderness in the left lower quadrant suprapubic region.  It appears less than yesterday.  No rigidity is noted.  Lab Results:  Recent Labs    01/15/24 1200 01/16/24 0440  WBC 11.8* 10.8*  HGB 15.0 14.2  HCT 45.2 42.5  PLT 229 231   BMET Recent Labs    01/15/24 1200 01/16/24 0440  NA 135 139  K 4.3 4.2  CL 102 101  CO2 23 26  GLUCOSE 97 105*  BUN 18 17  CREATININE 0.85 0.88  CALCIUM 9.1 8.9   PT/INR No results for input(s): LABPROT, INR in the last 72 hours.  Studies/Results: CT ABDOMEN PELVIS W CONTRAST Result Date: 01/15/2024 CLINICAL DATA:  Suprapubic abdominal pain. History of diverticulitis and oliguria. EXAM: CT ABDOMEN AND PELVIS WITH CONTRAST TECHNIQUE: Multidetector CT imaging of the abdomen and pelvis was performed using the standard protocol following bolus administration of intravenous contrast. RADIATION DOSE REDUCTION: This exam was performed according to the departmental dose-optimization program which includes automated exposure control, adjustment of the mA and/or kV according to patient size and/or use of iterative reconstruction technique. CONTRAST:  OMNIPAQUE  IOHEXOL  300 MG/ML  SOLN COMPARISON:  09/25/2022 FINDINGS: Lower chest: Mild scarring in the posterior basal segments of both lower lobes. Hepatobiliary:  Unremarkable Pancreas: Mild prominence of dorsal pancreatic duct measuring up to 4 mm in diameter in the pancreatic head, previously 3 mm. A specific cause is not visually apparent. Spleen: Unremarkable Adrenals/Urinary Tract: The kidneys and adrenal glands appear unremarkable. Wall thickening of the urinary bladder along the left lateral roof adjacent to a diverticular abscess. No gas in the urinary bladder lumen is identified. Stomach/Bowel: Sigmoid colon diverticulosis along with distal sigmoid colon diverticulitis including a paracolic and presumed diverticular abscess along the lower margin of a potential fistulous tract from the sigmoid colon, this abscess measures about 3.3 by 2.7 by 3.6 cm and sits just above the left bladder with secondary thickening of the left bladder roof. Vascular/Lymphatic: Atherosclerosis is present, including aortoiliac atherosclerotic disease. Calcified right inguinal lymph node 1.1 cm in short axis on image 84 series 2, stable. Reproductive: Unremarkable Other: No supplemental non-categorized findings. 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 Degenerative disc disease with loss of disc height noted at L4-5 and L5-S1. There is also a disc bulge at the L3-4 level. IMPRESSION: 1. Distal sigmoid colon diverticulitis with a diverticular abscess along the lower margin of a potential fistulous tract from the sigmoid colon, this abscess measures about 3.3 by 2.7 by 3.6 cm and sits just above the left bladder roof with secondary thickening of the left bladder roof. No gas in the urinary bladder lumen is identified to  indicate connectivity with the urinary bladder lumen. 2. Mild prominence of dorsal pancreatic duct measuring up to 4 mm in diameter in the pancreatic head,  previously 3 mm. A specific cause is not visually apparent and there is no biliary dilatation. Further workup with pancreatic protocol MRI with and without contrast is considered optional and should be primarily based on any history of pancreatic symptoms. 3. 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 4. Degenerative disc disease at L4-5 and L5-S1. 5.  Aortic Atherosclerosis (ICD10-I70.0). Electronically Signed   By: Ryan Salvage M.D.   On: 01/15/2024 13:56    Anti-infectives: Anti-infectives (From admission, onward)    Start     Dose/Rate Route Frequency Ordered Stop   01/15/24 2315  piperacillin -tazobactam (ZOSYN ) IVPB 3.375 g        3.375 g 12.5 mL/hr over 240 Minutes Intravenous Every 8 hours 01/15/24 2308     01/15/24 1415  piperacillin -tazobactam (ZOSYN ) IVPB 3.375 g        3.375 g 100 mL/hr over 30 Minutes Intravenous  Once 01/15/24 1400 01/15/24 1526       Assessment/Plan: Impression: Sigmoid diverticulitis with contained abscess.  Leukocytosis resolving. Plan: Awaiting IR percutaneous drain placement.  No need for acute surgical invention at the present time.  Discussed with Dr. Maree.  LOS: 1 day    James Blake 01/16/2024

## 2024-01-17 ENCOUNTER — Ambulatory Visit (HOSPITAL_COMMUNITY)
Admit: 2024-01-17 | Discharge: 2024-01-17 | Disposition: A | Discharge status: Home / Self Care | Source: Ambulatory Visit | Attending: Internal Medicine | Admitting: Internal Medicine

## 2024-01-17 ENCOUNTER — Encounter (HOSPITAL_COMMUNITY): Payer: Self-pay | Admitting: Internal Medicine

## 2024-01-17 DIAGNOSIS — K5720 Diverticulitis of large intestine with perforation and abscess without bleeding: Secondary | ICD-10-CM | POA: Insufficient documentation

## 2024-01-17 DIAGNOSIS — K5780 Diverticulitis of intestine, part unspecified, with perforation and abscess without bleeding: Secondary | ICD-10-CM | POA: Diagnosis not present

## 2024-01-17 LAB — CBC
HCT: 43.3 % (ref 39.0–52.0)
Hemoglobin: 14.3 g/dL (ref 13.0–17.0)
MCH: 29.9 pg (ref 26.0–34.0)
MCHC: 33 g/dL (ref 30.0–36.0)
MCV: 90.4 fL (ref 80.0–100.0)
Platelets: 241 K/uL (ref 150–400)
RBC: 4.79 MIL/uL (ref 4.22–5.81)
RDW: 13.8 % (ref 11.5–15.5)
WBC: 8.2 K/uL (ref 4.0–10.5)
nRBC: 0 % (ref 0.0–0.2)

## 2024-01-17 LAB — BASIC METABOLIC PANEL WITH GFR
Anion gap: 9 (ref 5–15)
BUN: 17 mg/dL (ref 8–23)
CO2: 26 mmol/L (ref 22–32)
Calcium: 9.1 mg/dL (ref 8.9–10.3)
Chloride: 102 mmol/L (ref 98–111)
Creatinine, Ser: 1 mg/dL (ref 0.61–1.24)
GFR, Estimated: >60 mL/min (ref >60)
Glucose, Bld: 98 mg/dL (ref 70–99)
Potassium: 4.4 mmol/L (ref 3.5–5.1)
Sodium: 137 mmol/L (ref 135–145)

## 2024-01-17 LAB — PROTIME-INR
INR: 0.9 (ref 0.8–1.2)
Prothrombin Time: 12.8 s (ref 11.4–15.2)

## 2024-01-17 LAB — MAGNESIUM: Magnesium: 2.2 mg/dL (ref 1.7–2.4)

## 2024-01-17 MED ORDER — MIDAZOLAM HCL 2 MG/2ML IJ SOLN
INTRAMUSCULAR | Status: AC | PRN
Start: 2024-01-17 — End: 2024-01-17

## 2024-01-17 MED ORDER — CHLORHEXIDINE GLUCONATE CLOTH 2 % EX PADS
6.0000 | MEDICATED_PAD | Freq: Every day | CUTANEOUS | Status: DC
  Administered 2024-01-17: 6 via TOPICAL

## 2024-01-17 MED ORDER — LIDOCAINE-EPINEPHRINE 1 %-1:100000 IJ SOLN: 20.0000 mL | Freq: Once | INTRAMUSCULAR | Status: DC

## 2024-01-17 MED ORDER — OXYCODONE HCL 5 MG PO TABS: 5.0000 mg | ORAL_TABLET | Freq: Three times a day (TID) | ORAL | 0 refills | Status: DC | PRN

## 2024-01-17 MED ORDER — FENTANYL CITRATE (PF) 100 MCG/2ML IJ SOLN
INTRAMUSCULAR | Status: AC
  Filled 2024-01-17: qty 2

## 2024-01-17 MED ORDER — AMOXICILLIN-POT CLAVULANATE 875-125 MG PO TABS: 1.0000 | ORAL_TABLET | Freq: Two times a day (BID) | ORAL | 0 refills | Status: AC

## 2024-01-17 MED ORDER — MIDAZOLAM HCL 2 MG/2ML IJ SOLN
INTRAMUSCULAR | Status: AC
Start: 2024-01-17 — End: 2024-01-17
  Filled 2024-01-17: qty 4

## 2024-01-17 NOTE — Consult Note (Signed)
 Chief Complaint: History of diverticulitis. Found to have an intra abdominal abscess. Request is for intra abdominal abscess drain placement.  Referring Physician(s): Dr. FORBES Carwin  Supervising Physician: Jennefer Rover  Patient Status: [facility] - Inpatient  History of Present Illness: James Blake is a 63 y.o. male 63 y.o. male inpatient. [facility]). Known to IR. History of diverticulitis and substance abuse. Previously had a left diverticular abscess drain placed by IR on 2.29.24. Removed on 3.28.24.  Presented to the ED at [facility] on 7.7.25 with lower abdominal pain and difficulty urinating. Found to have a intra abdominal abscess. CT Abd pelvis from 7.7.25 reads Distal sigmoid colon diverticulitis with a diverticular abscess along the lower margin of a potential fistulous tract from the sigmoid colon, this abscess measures about 3.3 by 2.7 by 3.6 cm and sits just above the left bladder roof with secondary thickening of the left bladder roof. No gas in the urinary bladder lumen is identified to indicate connectivity with the urinary bladder lumen. Team is requesting an intra abdominal abscess drain placement. Case Reviewed and approved by IR Attending Dr. ONEIDA Specking.  Currently without any significant complaints. Endorses pain with urination. Patient alert and laying in bed,calm. Denies any fevers, headache, chest pain, SOB, cough, abdominal pain, nausea, vomiting or bleeding.   All labs are within acceptable parameters. Patient is on subcutaneous prophylactic dose of lovenox . Last dose given on 7.7.25 @ 22:14. NKDA. Patient has been NPO since midnight.  Return precautions and treatment recommendations and follow-up discussed with the patient  who is agreeable with the plan.     Past Medical History:  Diagnosis Date   Diverticulitis    Substance abuse [facility])     Past Surgical History:  Procedure Laterality Date   DENTAL SURGERY     HERNIA REPAIR      Allergies: Patient has no  known allergies.  Medications: Prior to Admission medications   Medication Sig Start Date End Date Taking? Authorizing Provider  acetaminophen  (TYLENOL ) 325 MG tablet Take 650 mg by mouth every 6 (six) hours as needed for moderate pain.   Yes [provider]  cyclobenzaprine (FLEXERIL) 10 MG tablet Take 10 mg by mouth 3 (three) times daily as needed for muscle spasms. 12/25/23  Yes [provider]  ibuprofen  (ADVIL ) 200 MG tablet Take 2 tablets (400 mg total) by mouth every 8 (eight) hours as needed for moderate pain. 09/10/22  Yes Shahmehdi, Seyed A, MD  ciprofloxacin (CIPRO) 500 MG tablet Take 500 mg by mouth 2 (two) times daily.    [provider]  metroNIDAZOLE (FLAGYL) 500 MG tablet Take 500 mg by mouth 3 (three) times daily.    [provider]     Family History  Problem Relation Age of Onset   Diabetes Father    Heart failure Father     Social History   Socioeconomic History   Marital status: Divorced    Spouse name: Not on file   Number of children: Not on file   Years of education: Not on file   Highest education level: Not on file  Occupational History   Not on file  Tobacco Use   Smoking status: Every Day    Current packs/day: 2.00    Average packs/day: 2.0 packs/day for 40.0 years (80.0 ttl pk-yrs)    Types: Cigarettes    Passive exposure: Current   Smokeless tobacco: Never  Vaping Use   Vaping status: Never Used  Substance and Sexual  Activity   Alcohol use: Not Currently    Comment: rarely drinks etoh    Drug use: Yes    Frequency: 7.0 times per week    Types: Cocaine    Comment: smokes crack every day    Sexual activity: Not on file  Other Topics Concern   Not on file  Social History Narrative   Not on file   Social Drivers of Health   Financial Resource Strain: Not on file  Food Insecurity: No Food Insecurity (01/15/2024)   Hunger Vital Sign    Worried About Running Out of Food in the Last Year: Never true    Ran  Out of Food in the Last Year: Never true  Transportation Needs: No Transportation Needs (01/15/2024)   PRAPARE - Administrator, Civil Service (Medical): No    Lack of Transportation (Non-Medical): No  Physical Activity: Not on file  Stress: Not on file  Social Connections: Not on file      Review of Systems: A 12 point ROS discussed and pertinent positives are indicated in the HPI above.  All other systems are negative.  Review of Systems  Constitutional:  Negative for fever.  HENT:  Negative for congestion.   Respiratory:  Negative for cough and shortness of breath.   Cardiovascular:  Negative for chest pain.  Gastrointestinal:  Negative for abdominal pain.  Genitourinary:  Positive for difficulty urinating.  Neurological:  Negative for headaches.  Psychiatric/Behavioral:  Negative for behavioral problems and confusion.     Vital Signs: BP 129/69 (BP Location: Left Arm)   Pulse (!) 59   Temp 98 F (36.7 C) (Oral)   Resp 16   Ht 5' 11 (1.803 m)   Wt 158 lb 12.8 oz (72 kg)   SpO2 100%   BMI 22.15 kg/m     Physical Exam Vitals and nursing note reviewed.  Constitutional:      Appearance: He is well-developed.  HENT:     Head: Normocephalic.     Mouth/Throat:     Mouth: Mucous membranes are dry.  Cardiovascular:     Rate and Rhythm: Normal rate and regular rhythm.     Heart sounds: Normal heart sounds.  Pulmonary:     Effort: Pulmonary effort is normal.  Musculoskeletal:        General: Normal range of motion.     Cervical back: Normal range of motion.  Skin:    General: Skin is warm and dry.  Neurological:     General: No focal deficit present.     Mental Status: He is alert and oriented to person, place, and time. Mental status is at baseline.  Psychiatric:        Mood and Affect: Mood normal.        Behavior: Behavior normal.        Thought Content: Thought content normal.        Judgment: Judgment normal.     Imaging: CT ABDOMEN PELVIS W  CONTRAST Result Date: 01/15/2024 CLINICAL DATA:  Suprapubic abdominal pain. History of diverticulitis and oliguria. EXAM: CT ABDOMEN AND PELVIS WITH CONTRAST TECHNIQUE: Multidetector CT imaging of the abdomen and pelvis was performed using the standard protocol following bolus administration of intravenous contrast. RADIATION DOSE REDUCTION: This exam was performed according to the departmental dose-optimization program which includes automated exposure control, adjustment of the mA and/or kV according to patient size and/or use of iterative reconstruction technique. CONTRAST:  OMNIPAQUE  IOHEXOL  300 MG/ML  SOLN  COMPARISON:  09/25/2022 FINDINGS: Lower chest: Mild scarring in the posterior basal segments of both lower lobes. Hepatobiliary: Unremarkable Pancreas: Mild prominence of dorsal pancreatic duct measuring up to 4 mm in diameter in the pancreatic head, previously 3 mm. A specific cause is not visually apparent. Spleen: Unremarkable Adrenals/Urinary Tract: The kidneys and adrenal glands appear unremarkable. Wall thickening of the urinary bladder along the left lateral roof adjacent to a diverticular abscess. No gas in the urinary bladder lumen is identified. Stomach/Bowel: Sigmoid colon diverticulosis along with distal sigmoid colon diverticulitis including a paracolic and presumed diverticular abscess along the lower margin of a potential fistulous tract from the sigmoid colon, this abscess measures about 3.3 by 2.7 by 3.6 cm and sits just above the left bladder with secondary thickening of the left bladder roof. Vascular/Lymphatic: Atherosclerosis is present, including aortoiliac atherosclerotic disease. Calcified right inguinal lymph node 1.1 cm in short axis on image 84 series 2, stable. Reproductive: Unremarkable Other: No supplemental non-categorized findings. 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 Degenerative disc disease with loss of disc height noted at L4-5 and L5-S1. There is also a disc bulge at the L3-4 level. IMPRESSION: 1. Distal sigmoid colon diverticulitis with a diverticular abscess along the lower margin of a potential fistulous tract from the sigmoid colon, this abscess measures about 3.3 by 2.7 by 3.6 cm and sits just above the left bladder roof with secondary thickening of the left bladder roof. No gas in the urinary bladder lumen is identified to indicate connectivity with the urinary bladder lumen. 2. Mild prominence of dorsal pancreatic duct measuring up to 4 mm in diameter in the pancreatic head, previously 3 mm. A specific cause is not visually apparent and there is no biliary dilatation. Further workup with pancreatic protocol MRI with and without contrast is considered optional and should be primarily based on any history of pancreatic symptoms. 3. 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 4. Degenerative disc disease at L4-5 and L5-S1. 5.  Aortic Atherosclerosis (ICD10-I70.0). Electronically Signed   By: Ryan Salvage M.D.   On: 01/15/2024 13:56    Labs:  CBC: Recent Labs    01/15/24 1200 01/16/24 0440 01/17/24 0501  WBC 11.8* 10.8* 8.2  HGB 15.0 14.2 14.3  HCT 45.2 42.5 43.3  PLT 229 231 241    COAGS: Recent Labs    01/17/24 0501  INR 0.9    BMP: Recent Labs    01/15/24 1200 01/16/24 0440 01/17/24 0501  NA 135 139 137  K 4.3 4.2 4.4  CL 102 101 102  CO2 23 26 26   GLUCOSE 97 105* 98  BUN 18 17 17   CALCIUM 9.1 8.9 9.1  CREATININE 0.85 0.88 1.00  GFRNONAA >60 >60 >60    LIVER FUNCTION TESTS: Recent Labs    01/15/24 1200  BILITOT 0.6  AST 14*  ALT  13  ALKPHOS 59  PROT 7.3  ALBUMIN 3.7     Assessment and Plan:  63 y.o. male inpatient. [facility]). Known to IR. History of diverticulitis and substance abuse. Previously had a left diverticular abscess drain placed by IR on 2.29.24.  Removed on 3.28.24.  Presented to the ED at [facility] on 7.7.25 with lower abdominal pain and difficulty urinating. Found to have a intra abdominal abscess. CT Abd pelvis from 7.7.25 reads Distal sigmoid colon diverticulitis with a diverticular abscess along the lower margin of a potential fistulous tract from the sigmoid colon, this abscess measures about 3.3 by 2.7 by 3.6 cm and sits just above the left bladder roof with secondary thickening of the left bladder roof. No gas in the urinary bladder lumen is identified to indicate connectivity with the urinary bladder lumen. Team is requesting an intra abdominal abscess drain placement. Case Reviewed and approved by IR Attending Dr. ONEIDA Specking.  PLAN: IR Image Guided Intra Abdominal Abscess Drain Placement  Risks and benefits discussed with the patient including bleeding, infection, damage to adjacent structures, bowel perforation/fistula connection, and sepsis.  All of the patient's questions were answered, patient is agreeable to proceed. Consent signed and in chart.  Thank you for this interesting consult.  I greatly enjoyed meeting James Blake and look forward to participating in their care.  A copy of this report was sent to the requesting provider on this date.  Electronically Signed: Delon JAYSON Beagle, NP 01/17/2024, 8:25 AM   I spent a total of 40 Minutes   in face to face in clinical consultation, greater than 50% of which was counseling/coordinating care for intra abdominal abscess drain

## 2024-01-17 NOTE — Plan of Care (Signed)

## 2024-01-17 NOTE — Sedation Documentation (Signed)
 Per Dr. Jennefer, IR procedure cancelled. No medications administered. Pt. To transport back to [facility] via [facility]. Foley catheter removed per Dr. Jennefer. PIV x1 in place, Pt. A&Ox4.

## 2024-01-17 NOTE — Procedures (Signed)
 Interventional Radiology Procedure Note  Procedure: Aborted CT guided pelvic drain placement   Findings: Please refer to procedural dictation for full description. Decreased size of pelvic abscess without safe window for percutaneous access.  Complications: n/a  Estimated Blood Loss: n/a  Recommendations: Continue antibiotics.  If the patient's condition worsens or fails to improve, consider repeat CT and re-consult if collection has enlarged.   Ester Sides, MD

## 2024-01-17 NOTE — Progress Notes (Signed)
 Subjective: Patient states his suprapubic and left lower quadrant abdominal pain are less.  He feels like he has less pressure on his bladder.  Objective: Vital signs in last 24 hours: Temp:  [97.6 F (36.4 C)-98 F (36.7 C)] 97.6 F (36.4 C) (07/09 1330) Pulse Rate:  [51-66] 66 (07/09 1330) Resp:  [12-16] 16 (07/09 1330) BP: (107-129)/(69-84) 119/84 (07/09 1330) SpO2:  [99 %-100 %] 100 % (07/09 1330) Last BM Date : 01/17/24  Intake/Output from previous day: 07/08 0701 - 07/09 0700 In: 573.9 [I.V.:450.8; IV Piggyback:123.1] Out: -  Intake/Output this shift: No intake/output data recorded.  General appearance: alert, cooperative, and no distress GI: Soft, flat.  Minimal discomfort to deep palpation in suprapubic and left lower quadrant region.  No rigidity is noted.  Lab Results:  Recent Labs    01/16/24 0440 01/17/24 0501  WBC 10.8* 8.2  HGB 14.2 14.3  HCT 42.5 43.3  PLT 231 241   BMET Recent Labs    01/16/24 0440 01/17/24 0501  NA 139 137  K 4.2 4.4  CL 101 102  CO2 26 26  GLUCOSE 105* 98  BUN 17 17  CREATININE 0.88 1.00  CALCIUM 8.9 9.1   PT/INR Recent Labs    01/17/24 0501  LABPROT 12.8  INR 0.9    Studies/Results: CT PELVIS LIMITED WO CONTRAST Result Date: 01/17/2024 INDICATION: 63 year old history diverticular abscess presenting for drain placement. EXAM: CT PELVIS LIMITED WITHOUT CONTRAST COMPARISON:  01/15/2024 MEDICATIONS: The patient is currently admitted to the hospital and receiving intravenous antibiotics. The antibiotics were administered within an appropriate time frame prior to the initiation of the procedure. ANESTHESIA/SEDATION: None. CONTRAST:  None COMPLICATIONS: None immediate. PROCEDURE: RADIATION DOSE REDUCTION: This exam was performed according to the departmental dose-optimization program which includes automated exposure control, adjustment of the mA and/or kV according to patient size and/or use of iterative reconstruction  technique. Informed written consent was obtained from the patient after a discussion of the risks, benefits and alternatives to treatment. The patient was placed supine on the CT gantry and a pre procedural CT was performed re-demonstrating significantly decreased size of previously visualized central pelvic diverticular abscess. The collection now measures approximately 1.7 cm in maximum axial dimension, previously 3.6 cm. Despite Foley catheter bladder decompression, there is no safe percutaneous window to access the collection for aspiration. These findings were discussed with the patient in detail. IMPRESSION: Aborted CT-guided pelvic abscess drain placement due to significantly decreased size of the fluid collection and no safe window for aspiration. James Sides, MD [facility] Radiology Electronically Signed   By: James Blake M.D.   On: 01/17/2024 12:17    Anti-infectives: Anti-infectives (From admission, onward)    Start     Dose/Rate Route Frequency Ordered Stop   01/15/24 2315  piperacillin -tazobactam (ZOSYN ) IVPB 3.375 g        3.375 g 12.5 mL/hr over 240 Minutes Intravenous Every 8 hours 01/15/24 2308     01/15/24 1415  piperacillin -tazobactam (ZOSYN ) IVPB 3.375 g        3.375 g 100 mL/hr over 30 Minutes Intravenous  Once 01/15/24 1400 01/15/24 1526       Assessment/Plan: Impression: Sigmoid diverticulitis with contained perforation, resolving.  The abscess was not drainable as it is resolving on its own. Plan: Okay for discharge from surgery standpoint.  He should be on Augmentin  for 1 week.  Will see the patient in follow-up in 3 weeks in my office.  He was  instructed to call my office for the appointment.  Discussed with Dr. Loreli.  LOS: 2 days    James Blake 01/17/2024

## 2024-01-17 NOTE — Discharge Summary (Signed)
 Physician Discharge Summary  CAM DAUPHIN FMW:[id number] DOB: 1960/10/25 DOA: 01/15/2024  PCP: Marvine Rush, MD  Admit date: 01/15/2024  Discharge date: 01/17/2024  Admitted From: Home  Disposition: Home  Recommendations for Outpatient Follow-up:  Follow up with PCP in 1-2 weeks Follow-up with Dr. Mavis in 3 weeks Continue on Augmentin  as prescribed for 7 days Oxycodone  prescribed as needed for significant pain for 20 tablets and 0 refills Continue other home medications as prior  Home Health: None  Equipment/Devices: None  Discharge Condition:Stable  CODE STATUS: Full  Diet recommendation: Heart Healthy  Brief/Interim Summary: James Blake is a 63 y.o. male with medical history significant for tobacco abuse, diverticulitis.  Patient presented to the ED with complaints of mid lower abdominal pain over the past few weeks.  No vomiting or diarrhea.  Patient was admitted for colonic diverticular abscess and has been seen by general surgery with plans for IR drainage of the abscess by 7/9.  He was seen by IR and noted to have decreased size of pelvic abscess without a safe window for percutaneous access and therefore the procedure was aborted.  He was seen again by general surgery with recommendations to remain on oral antibiotics for 7 days and follow-up in the office in 3 weeks.  No other acute events or concerns noted.    Discharge Diagnoses:  Principal Problem:   Colonic diverticular abscess  Principal discharge diagnosis: Colonic diverticular abscess.  Discharge Instructions  Discharge Instructions     Diet - low sodium heart healthy   Complete by: As directed    Increase activity slowly   Complete by: As directed       Allergies as of 01/17/2024   No Known Allergies      Medication List     STOP taking these medications    ciprofloxacin 500 MG tablet Commonly known as: CIPRO   metroNIDAZOLE 500 MG tablet Commonly known as: FLAGYL       TAKE  these medications    acetaminophen  325 MG tablet Commonly known as: TYLENOL  Take 650 mg by mouth every 6 (six) hours as needed for moderate pain.   amoxicillin -clavulanate 875-125 MG tablet Commonly known as: AUGMENTIN  Take 1 tablet by mouth 2 (two) times daily for 7 days.   cyclobenzaprine 10 MG tablet Commonly known as: FLEXERIL Take 10 mg by mouth 3 (three) times daily as needed for muscle spasms.   ibuprofen  200 MG tablet Commonly known as: ADVIL  Take 2 tablets (400 mg total) by mouth every 8 (eight) hours as needed for moderate pain.   oxyCODONE  5 MG immediate release tablet Commonly known as: Roxicodone  Take 1 tablet (5 mg total) by mouth every 8 (eight) hours as needed for severe pain (pain score 7-10), moderate pain (pain score 4-6) or breakthrough pain.        Follow-up Information     Marvine Rush, MD. Schedule an appointment as soon as possible for a visit in 1 week(s).   Specialty: Family Medicine Contact information: [address] [place] [address] [phone]         Mavis Anes, MD. Schedule an appointment as soon as possible for a visit in 3 week(s).   Specialty: General Surgery Contact information: [address] [place] [address] [phone]                No Known Allergies  Consultations: General surgery IR   Procedures/Studies: CT PELVIS LIMITED WO CONTRAST Result Date: 01/17/2024 INDICATION: 63 year old history diverticular abscess  presenting for drain placement. EXAM: CT PELVIS LIMITED WITHOUT CONTRAST COMPARISON:  01/15/2024 MEDICATIONS: The patient is currently admitted to the hospital and receiving intravenous antibiotics. The antibiotics were administered within an appropriate time frame prior to the initiation of the procedure. ANESTHESIA/SEDATION: None. CONTRAST:  None COMPLICATIONS: None immediate. PROCEDURE: RADIATION DOSE REDUCTION: This exam was performed according to the departmental  dose-optimization program which includes automated exposure control, adjustment of the mA and/or kV according to patient size and/or use of iterative reconstruction technique. Informed written consent was obtained from the patient after a discussion of the risks, benefits and alternatives to treatment. The patient was placed supine on the CT gantry and a pre procedural CT was performed re-demonstrating significantly decreased size of previously visualized central pelvic diverticular abscess. The collection now measures approximately 1.7 cm in maximum axial dimension, previously 3.6 cm. Despite Foley catheter bladder decompression, there is no safe percutaneous window to access the collection for aspiration. These findings were discussed with the patient in detail. IMPRESSION: Aborted CT-guided pelvic abscess drain placement due to significantly decreased size of the fluid collection and no safe window for aspiration. Ester Sides, MD [facility] Electronically Signed   By: Ester Sides M.D.   On: 01/17/2024 12:17   CT ABDOMEN PELVIS W CONTRAST Result Date: 01/15/2024 CLINICAL DATA:  Suprapubic abdominal pain. History of diverticulitis and oliguria. EXAM: CT ABDOMEN AND PELVIS WITH CONTRAST TECHNIQUE: Multidetector CT imaging of the abdomen and pelvis was performed using the standard protocol following bolus administration of intravenous contrast. RADIATION DOSE REDUCTION: This exam was performed according to the departmental dose-optimization program which includes automated exposure control, adjustment of the mA and/or kV according to patient size and/or use of iterative reconstruction technique. CONTRAST:  OMNIPAQUE  IOHEXOL  300 MG/ML  SOLN COMPARISON:  09/25/2022 FINDINGS: Lower chest: Mild scarring in the posterior basal segments of both lower lobes. Hepatobiliary: Unremarkable Pancreas: Mild prominence of dorsal pancreatic duct measuring up to 4  mm in diameter in the pancreatic head, previously 3 mm. A specific cause is not visually apparent. Spleen: Unremarkable Adrenals/Urinary Tract: The kidneys and adrenal glands appear unremarkable. Wall thickening of the urinary bladder along the left lateral roof adjacent to a diverticular abscess. No gas in the urinary bladder lumen is identified. Stomach/Bowel: Sigmoid colon diverticulosis along with distal sigmoid colon diverticulitis including a paracolic and presumed diverticular abscess along the lower margin of a potential fistulous tract from the sigmoid colon, this abscess measures about 3.3 by 2.7 by 3.6 cm and sits just above the left bladder with secondary thickening of the left bladder roof. Vascular/Lymphatic: Atherosclerosis is present, including aortoiliac atherosclerotic disease. Calcified right inguinal lymph node 1.1 cm in short axis on image 84 series 2, stable. Reproductive: Unremarkable Other: No supplemental non-categorized findings. 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 Degenerative disc disease with loss of disc height noted at L4-5 and L5-S1. There is also a disc bulge at the L3-4 level. IMPRESSION: 1. Distal sigmoid colon diverticulitis with a diverticular abscess along the lower margin of a potential fistulous tract from the sigmoid colon, this abscess measures about 3.3 by 2.7 by 3.6 cm and sits just above the left bladder roof with secondary thickening of the left bladder roof. No gas in the urinary bladder lumen  is identified to indicate connectivity with the urinary bladder lumen. 2. Mild prominence of dorsal pancreatic duct measuring up to 4 mm in diameter in the pancreatic head, previously 3 mm. A specific cause is not visually apparent and there is no biliary  dilatation. Further workup with pancreatic protocol MRI with and without contrast is considered optional and should be primarily based on any history of pancreatic symptoms. 3. 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 4. Degenerative disc disease at L4-5 and L5-S1. 5.  Aortic Atherosclerosis (ICD10-I70.0). Electronically Signed   By: Ryan Salvage M.D.   On: 01/15/2024 13:56     Discharge Exam: Vitals:   01/17/24 0459 01/17/24 1330  BP: 129/69 119/84  Pulse: (!) 59 66  Resp: 16 16  Temp: 98 F (36.7 C) 97.6 F (36.4 C)  SpO2: 100% 100%   Vitals:   01/16/24 1236 01/16/24 1938 01/17/24 0459 01/17/24 1330  BP: 116/72 118/72 129/69 119/84  Pulse: 64 60 (!) 59 66  Resp: 18 14 16 16   Temp: 98.1 F (36.7 C) 98 F (36.7 C) 98 F (36.7 C) 97.6 F (36.4 C)  TempSrc: Oral Oral Oral Oral  SpO2: 99% 99% 100% 100%  Weight:      Height:        General: Pt is alert, awake, not in acute distress Cardiovascular: RRR, S1/S2 +, no rubs, no gallops Respiratory: CTA bilaterally, no wheezing, no rhonchi Abdominal: Soft, NT, ND, bowel sounds + Extremities: no edema, no cyanosis    The results of significant diagnostics from this hospitalization (including imaging, microbiology, ancillary and laboratory) are listed below for reference.     Microbiology: No results found for this or any previous visit (from the past 240 hours).   Labs: BNP (last 3 results) No results for input(s): BNP in the last 8760 hours. Basic Metabolic Panel: Recent Labs  Lab 01/15/24 1200 01/16/24 0440 01/17/24 0501  NA 135 139 137  K 4.3 4.2 4.4  CL 102 101 102  CO2 23 26 26   GLUCOSE 97 105* 98  BUN 18 17 17   CREATININE 0.85 0.88 1.00  CALCIUM 9.1 8.9 9.1  MG  --   --  2.2   Liver Function Tests: Recent Labs  Lab  01/15/24 1200  AST 14*  ALT 13  ALKPHOS 59  BILITOT 0.6  PROT 7.3  ALBUMIN 3.7   Recent Labs  Lab 01/15/24 1200  LIPASE 30   No results for input(s): AMMONIA in the last 168 hours. CBC: Recent Labs  Lab 01/15/24 1200 01/16/24 0440 01/17/24 0501  WBC 11.8* 10.8* 8.2  NEUTROABS 8.0*  --   --   HGB 15.0 14.2 14.3  HCT 45.2 42.5 43.3  MCV 88.6 89.1 90.4  PLT 229 231 241   Cardiac Enzymes: No results for input(s): CKTOTAL, CKMB, CKMBINDEX, TROPONINI in the last 168 hours. BNP: Invalid input(s): POCBNP CBG: No results for input(s): GLUCAP in the last 168 hours. D-Dimer No results for input(s): DDIMER in the last 72 hours. Hgb A1c No results for input(s): HGBA1C in the last 72 hours. Lipid Profile No results for input(s): CHOL, HDL, LDLCALC, TRIG, CHOLHDL, LDLDIRECT in the last 72 hours. Thyroid function studies No results for input(s): TSH, T4TOTAL, T3FREE, THYROIDAB in the last 72 hours.  Invalid input(s): FREET3 Anemia work up No results  for input(s): VITAMINB12, FOLATE, FERRITIN, TIBC, IRON, RETICCTPCT in the last 72 hours. Urinalysis    Component Value Date/Time   COLORURINE YELLOW 01/15/2024 1230   APPEARANCEUR CLEAR 01/15/2024 1230   LABSPEC 1.014 01/15/2024 1230   PHURINE 7.0 01/15/2024 1230   GLUCOSEU NEGATIVE 01/15/2024 1230   HGBUR NEGATIVE 01/15/2024 1230   BILIRUBINUR NEGATIVE 01/15/2024 1230   BILIRUBINUR negative 09/07/2022 1922   KETONESUR NEGATIVE 01/15/2024 1230   PROTEINUR NEGATIVE 01/15/2024 1230   UROBILINOGEN 0.2 09/07/2022 1922   UROBILINOGEN 0.2 01/22/2011 0610   NITRITE NEGATIVE 01/15/2024 1230   LEUKOCYTESUR NEGATIVE 01/15/2024 1230   Sepsis Labs Recent Labs  Lab 01/15/24 1200 01/16/24 0440 01/17/24 0501  WBC 11.8* 10.8* 8.2   Microbiology No results found for this or any previous visit (from the past 240 hours).   Time coordinating discharge: 35  minutes  SIGNED:   Adron JONETTA Fairly, DO [facility] 01/17/2024, 2:40 PM  If 7PM-7AM, please contact night-coverage [email or web address]

## 2024-02-02 ENCOUNTER — Ambulatory Visit: Payer: Self-pay

## 2024-02-20 ENCOUNTER — Encounter: Payer: Self-pay | Admitting: General Surgery

## 2024-02-20 ENCOUNTER — Ambulatory Visit (INDEPENDENT_AMBULATORY_CARE_PROVIDER_SITE_OTHER): Admitting: General Surgery

## 2024-02-20 ENCOUNTER — Other Ambulatory Visit: Payer: Self-pay

## 2024-02-20 VITALS — BP 124/80 | HR 72 | Temp 98.3°F | Resp 18 | Ht 71.0 in | Wt 161.4 lb

## 2024-02-20 DIAGNOSIS — Z1211 Encounter for screening for malignant neoplasm of colon: Secondary | ICD-10-CM

## 2024-02-20 DIAGNOSIS — K5732 Diverticulitis of large intestine without perforation or abscess without bleeding: Secondary | ICD-10-CM

## 2024-02-20 DIAGNOSIS — K5720 Diverticulitis of large intestine with perforation and abscess without bleeding: Secondary | ICD-10-CM

## 2024-02-20 MED ORDER — METRONIDAZOLE 500 MG PO TABS: 500.0000 mg | ORAL_TABLET | Freq: Three times a day (TID) | ORAL | 0 refills | Status: DC

## 2024-02-20 MED ORDER — SUTAB 1479-225-188 MG PO TABS: 24.0000 | ORAL_TABLET | Freq: Once | ORAL | 0 refills | Status: AC

## 2024-02-20 MED ORDER — NEOMYCIN SULFATE 500 MG PO TABS: 1000.0000 mg | ORAL_TABLET | Freq: Three times a day (TID) | ORAL | 0 refills | Status: AC

## 2024-02-20 NOTE — H&P (Signed)
 James Blake is an 63 y.o. male.   Chief Complaint: Recurrent sigmoid diverticulitis  HPI: Patient is a 63 year old white male who presents for a partial colectomy.  He has had recurrent episodes of sigmoid diverticulitis complicated with abscess formation.  His last episode was early July 2025.  Due to the recurrent nature of the sigmoid diverticulitis, the patient is now presenting for a partial colectomy.  He is undergoing a screening colonoscopy on 03/05/2024. He does report taking cocaine, though he has not taken cocaine since 02/17/2024. Past Medical History:  Diagnosis Date   Diverticulitis    Substance abuse [facility])     Past Surgical History:  Procedure Laterality Date   DENTAL SURGERY     HERNIA REPAIR      Family History  Problem Relation Age of Onset   Diabetes Father    Heart failure Father    Social History:  reports that he has been smoking cigarettes. He has a 80 pack-year smoking history. He has been exposed to tobacco smoke. He has never used smokeless tobacco. He reports that he does not currently use alcohol. He reports current drug use. Frequency: 7.00 times per week. Drug: Cocaine.  Allergies: No Known Allergies  No medications prior to admission.    No results found for this or any previous visit (from the past 48 hours). No results found.  Review of Systems  Constitutional: Negative.   HENT: Negative.    Eyes: Negative.   Respiratory: Negative.    Cardiovascular: Negative.   Gastrointestinal:  Positive for abdominal pain.  Endocrine: Negative.   Genitourinary: Negative.   Musculoskeletal:  Positive for back pain and neck pain.  Allergic/Immunologic: Negative.   Neurological: Negative.   Hematological: Negative.   Psychiatric/Behavioral: Negative.      There were no vitals taken for this visit. Physical Exam Vitals reviewed.  Constitutional:      Appearance: Normal appearance. He is normal weight. He is not ill-appearing.  HENT:     Head:  Normocephalic and atraumatic.  Cardiovascular:     Rate and Rhythm: Normal rate and regular rhythm.     Heart sounds: Normal heart sounds. No murmur heard.    No friction rub. No gallop.  Pulmonary:     Effort: Pulmonary effort is normal. No respiratory distress.     Breath sounds: Normal breath sounds. No stridor. No wheezing, rhonchi or rales.  Abdominal:     General: Abdomen is flat. Bowel sounds are normal. There is no distension.     Palpations: Abdomen is soft. There is no mass.     Tenderness: There is no abdominal tenderness. There is no guarding or rebound.     Hernia: No hernia is present.  Skin:    General: Skin is warm and dry.  Neurological:     Mental Status: He is alert and oriented to person, place, and time.      Assessment/Plan Impression: Recurrent sigmoid diverticulitis  Plan: Patient is scheduled for a partial colectomy on 03/06/2024.  The risks and benefits of the procedure including bleeding, infection, cardiopulmonary difficulties, anastomotic leak, the possibility of a colostomy were fully explained to the patient, who gave informed consent.  Sutabs, Flagyl , neomycin  have been prescribed preoperatively for bowel preparation.  Patient has been told that he will be tested for cocaine.

## 2024-02-20 NOTE — Progress Notes (Signed)
 Subjective:     James Blake  Patient here for follow-up hospitalization for sigmoid diverticulitis with abscess.  He states he is doing much better and only has occasional left lower quadrant and suprapubic tenderness.  No dysuria or pneumaturia have been noted.  His bowel movements are regular.  He would like to proceed with scheduling a partial colectomy.  He also states he has not done cocaine in the last 3 days. Objective:    BP 124/80   Pulse 72   Temp 98.3 F (36.8 C) (Oral)   Resp 18   Ht 5' 11 (1.803 m)   Wt 161 lb 6.4 oz (73.2 kg)   SpO2 96%   BMI 22.51 kg/m   General:  alert, cooperative, and no distress  Abdomen is soft, nontender, nondistended. Previous hospitalization report reviewed     Assessment:    Recurrent sigmoid diverticulitis with resolving abscess.    Plan:   As the patient has had recurrent episodes of diverticulitis complicated with abscesses, surgery is indicated.  Patient has never had a colonoscopy, thus this will need to be scheduled prior to his surgery.  He is scheduled for screening colonoscopy on 03/05/2024 and a partial colectomy on 03/06/2024.  The risks and benefits of both procedures including bleeding, perforation, cardiopulmonary difficulties, anastomotic leak, and the possibility of a colostomy were fully explained to the patient, who gave informed consent.  Sutabs, Flagyl , neomycin  have been prescribed for bowel preparation.  He was told that he will be tested for cocaine and the surgery will be canceled if it is positive.  He understands this.

## 2024-02-20 NOTE — H&P (Signed)
 James Blake is an 63 y.o. male.   Chief Complaint: Need for screening colonoscopy  HPI: Patient is a 63 year old white male who presents for screening colonoscopy.  He has never had a colonoscopy.  He has had multiple episodes of sigmoid diverticulitis in the past.  His last episode was early in July 2025.  He has had multiple episodes in the past which required percutaneous drainage of the abscess.  He denies any family history of colon cancer.  He currently denies any fever, chills, diarrhea, constipation.  He denies any blood in his stool.  Past Medical History:  Diagnosis Date   Diverticulitis    Substance abuse [facility])     Past Surgical History:  Procedure Laterality Date   DENTAL SURGERY     HERNIA REPAIR      Family History  Problem Relation Age of Onset   Diabetes Father    Heart failure Father    Social History:  reports that he has been smoking cigarettes. He has a 80 pack-year smoking history. He has been exposed to tobacco smoke. He has never used smokeless tobacco. He reports that he does not currently use alcohol. He reports current drug use. Frequency: 7.00 times per week. Drug: Cocaine.  Allergies: No Known Allergies  No medications prior to admission.    No results found for this or any previous visit (from the past 48 hours). No results found.  Review of Systems  Constitutional: Negative.   HENT: Negative.    Eyes: Negative.   Respiratory: Negative.    Cardiovascular: Negative.   Gastrointestinal:  Positive for abdominal pain.  Endocrine: Negative.   Genitourinary: Negative.   Musculoskeletal:  Positive for back pain and neck pain.  Allergic/Immunologic: Negative.   Neurological: Negative.   Hematological: Negative.   Psychiatric/Behavioral: Negative.      There were no vitals taken for this visit. Physical Exam Vitals reviewed.  Constitutional:      Appearance: Normal appearance. He is normal weight. He is not ill-appearing.  HENT:     Head:  Normocephalic and atraumatic.  Cardiovascular:     Rate and Rhythm: Normal rate and regular rhythm.     Heart sounds: Normal heart sounds. No murmur heard.    No friction rub. No gallop.  Pulmonary:     Effort: Pulmonary effort is normal. No respiratory distress.     Breath sounds: Normal breath sounds. No stridor. No wheezing, rhonchi or rales.  Abdominal:     General: Abdomen is flat. Bowel sounds are normal. There is no distension.     Palpations: Abdomen is soft. There is no mass.     Tenderness: There is no abdominal tenderness. There is no guarding or rebound.     Hernia: No hernia is present.  Skin:    General: Skin is warm and dry.  Neurological:     Mental Status: He is alert and oriented to person, place, and time.      Assessment/Plan Impression: Need for screening colonoscopy, history of diverticulitis Plan: Patient is scheduled for screening colonoscopy on 03/05/2024.  The risks and benefits of the procedure including bleeding and perforation were fully explained to the patient, who gave informed consent.  Sutabs have been prescribed preoperatively for bowel preparation.  [name], MD 02/20/2024, 5:07 PM

## 2024-02-28 NOTE — Patient Instructions (Signed)
 James Blake  02/28/2024     @PREFPERIOPPHARMACY @   Your procedure is scheduled on  03/06/2024.   Report to James Blake at  [phone]  A.M.   Call this number if you have problems the morning of surgery:  [phone]  If you experience any cold or flu symptoms such as cough, fever, chills, shortness of breath, etc. between now and your scheduled surgery, please notify us  at the above number.   Remember:   Follow the diet and prep instructions given to you by the office.         Drink 2 carb drinks at 10 pm on 03/05/2024.   You may drink clear liquids until 0330 am on 03/06/2024.    Clear liquids allowed are:                    Water, Juice (No red color; non-citric and without pulp; diabetics please choose diet or no sugar options), Carbonated beverages (diabetics please choose diet or no sugar options), Clear Tea (No creamer, milk, or cream, including half & half and powdered creamer), Black Coffee Only (No creamer, milk or cream, including half & half and powdered creamer), and Clear Sports drink (No red color; diabetics please choose diet or no sugar options)           At 0330 am on 03/06/2024 drink 1 carb drink. You can have nothing after this to drink.   Take these medicines the morning of surgery with A SIP OF WATER                                   cyclobenzaprine(if needed).    Do not wear jewelry, make-up or nail polish, including gel polish,  artificial nails, or any other type of covering on natural nails (fingers and  toes).  Do not wear lotions, powders, or perfumes, or deodorant.  Do not shave 48 hours prior to surgery.  Men may shave face and neck.  Do not bring valuables to the hospital.  [facility] is not responsible for any belongings or valuables.  Contacts, dentures or bridgework may not be worn into surgery.  Leave your suitcase in the car.  After surgery it may be brought to your room.  For patients admitted to the hospital, discharge time will  be determined by your treatment team.  Patients discharged the day of surgery will not be allowed to drive home and must have someone with them for 24 hours.    Special instructions:   DO NOT smoke tobacco or vape for 24 hours before your procedure.  Please read over the following fact sheets that you were given. Pain Booklet, Coughing and Deep Breathing, Blood Transfusion Information, MRSA Information, Surgical Site Infection Prevention, Anesthesia Post-op Instructions, and Care and Recovery After Surgery        Open Partial Colectomy, Adult, Care After The following information offers guidance on how to care for yourself after your procedure. Your health care provider may also give you more specific instructions. If you have problems or questions, contact your health care provider. What can I expect after the surgery? After the procedure, it is common to have: Pain, bruising, or swelling in your abdomen, especially in the incision areas. You will be given medicine to control the pain. Tiredness. Your energy level will return to normal over the next several weeks. Changes in  your bowel movements, especially having bowel movements more often. Bloating. Nausea. Trouble urinating. Follow these instructions at home: Medicines Take over-the-counter and prescription medicines only as told by your health care provider. Ask your health care provider if the medicine prescribed to you: Requires you to avoid driving or using machinery. Can cause constipation. You may need to take these actions to prevent or treat constipation: Drink enough fluids to keep your urine pale yellow. Take over-the-counter or prescription medicines, including stool softeners. Limit foods that are high in fat and processed sugars, such as fried or sweet foods. Eating and drinking Follow instructions from your health care provider about eating or drinking restrictions. Do not drink alcohol if your health care  provider tells you not to drink. Eat a low-fiber diet for the first 4 weeks after surgery. Most people on a low-fiber eating plan should eat less than 10 grams (g) of fiber a day. Follow recommendations from your health care provider or dietician about how much fiber you should have each day. Always check food labels to know the fiber content of packaged foods. In general, a low-fiber food will have fewer than 2 g of fiber per serving. In general, try to avoid whole grains, raw fruits and vegetables, dried fruit, tough cuts of meat, nuts, and seeds. Incision care  Follow instructions from your health care provider about how to take care of your incision. Make sure you: Wash your hands with soap and water for at least 20 seconds before and after you change your bandage (dressing). If soap and water are not available, use hand sanitizer. Change your dressing as told by your health care provider. Leave stitches (sutures), skin glue, or adhesive strips in place. These skin closures may need to stay in place for 2 weeks or longer. If adhesive strip edges start to loosen and curl up, you may trim the loose edges. Do not remove adhesive strips completely unless your health care provider tells you to do that. Check your incision area every day for signs of infection. Check for: More redness, swelling, or pain. Fluid or blood. Warmth. Pus or a bad smell. Keep your incisions clean and dry. Avoid wearing tight clothing around your incision. Do not take baths, swim, or use a hot tub until your health care provider approves. Ask your health care provider if you may take showers. You may only be allowed to take sponge baths. Activity  Rest as told by your health care provider. Avoid sitting for a long time without moving. Get up and take short walks every 1-2 hours. This is important to improve blood flow and breathing. Ask for help if you feel weak or unsteady. You may have to avoid lifting. Ask your  health care provider how much you can safely lift. Do deep breathing exercises as told by your health care provider. Place your hands or a pillow over your incision area before you cough. Return to your normal activities as told by your health care provider. Ask your health care provider what activities are safe for you. General instructions Do not use any products that contain nicotine  or tobacco. These products include cigarettes, chewing tobacco, and vaping devices, such as e-cigarettes. If you need help quitting, ask your health care provider. Wear compression stockings as told by your health care provider. These stockings help to prevent blood clots and reduce swelling in your legs. Keep all follow-up visits. This is important to monitor healing and check for any complications. Contact a health care  provider if: You have not had a bowel movement in 3 days after surgery. You have chills or fever. Medicine is not controlling your pain. You have any signs of infection at your incision area. You have nausea and vomiting that will not go away. Get help right away if: You have chest pain or trouble breathing. You have a warm, tender swelling in your leg. Your incision breaks open. You have severe pain. You have bleeding from the rectum. These symptoms may be an emergency. Get help right away. Call 911. Do not wait to see if the symptoms will go away. Do not drive yourself to the hospital. Summary After your procedure, it is common to have pain and to be tired. Your incision area may also be sore and tender. Take over-the-counter and prescription medicines only as told by your health care provider. Return to your normal activities as told by your health care provider. Follow instructions from your health care provider about how to take care of your incision. Get help right away if you have chest pain or trouble breathing. This information is not intended to replace advice given to you by  your health care provider. Make sure you discuss any questions you have with your health care provider. Document Revised: 10/13/2021 Document Reviewed: 10/13/2021 Elsevier Patient Education  2024 [employer]  How to Use Chlorhexidine  at Home in the Shower Chlorhexidine  gluconate (CHG) is a germ-killing (antiseptic) wash that's used to clean the skin. It can get rid of the germs that normally live on the skin and can keep them away for about 24 hours. If you're having surgery, you may be told to shower with CHG at home the night before surgery. This can help lower your risk for infection. To use CHG wash in the shower, follow the steps below. Supplies needed: CHG body wash. Clean washcloth. Clean towel. How to use CHG in the shower Follow these steps unless you're told to use CHG in a different way: Start the shower. Use your normal soap and shampoo to wash your face and hair. Turn off the shower or move out of the shower stream. Pour CHG onto a clean washcloth. Do not use any type of brush or rough sponge. Start at your neck, washing your body down to your toes. Make sure you: Wash the part of your body where the surgery will be done for at least 1 minute. Do not scrub. Do not use CHG on your head or face unless your health care provider tells you to. If it gets into your ears or eyes, rinse them well with water. Do not wash your genitals with CHG. Wash your back and under your arms. Make sure to wash skin folds. Let the CHG sit on your skin for 1-2 minutes or as long as told. Rinse your entire body in the shower, including all body creases and folds. Turn off the shower. Dry off with a clean towel. Do not put anything on your skin afterward, such as powder, lotion, or perfume. Put on clean clothes or pajamas. If it's the night before surgery, sleep in clean sheets. General tips Use CHG only as told, and follow the instructions on the label. Use the full amount of CHG as told. This  is often one bottle. Do not smoke and stay away from flames after using CHG. Your skin may feel sticky after using CHG. This is normal. The sticky feeling will go away as the CHG dries. Do not use CHG: If you  have a chlorhexidine  allergy or have reacted to chlorhexidine  in the past. On open wounds or areas of skin that have broken skin, cuts, or scrapes. On babies younger than 71 months of age. Contact a health care provider if: You have questions about using CHG. Your skin gets irritated or itchy. You have a rash after using CHG. You swallow any CHG. Call your local poison control center [phone] in the [place] Your eyes itch badly, or they become very red or swollen. Your hearing changes. You have trouble seeing. If you can't reach your provider, go to an urgent care or emergency room. Do not drive yourself. Get help right away if: You have swelling or tingling in your mouth or throat. You make high-pitched whistling sounds when you breathe, most often when you breathe out (wheeze). You have trouble breathing. These symptoms may be an emergency. Call 911 right away. Do not wait to see if the symptoms will go away. Do not drive yourself to the hospital. This information is not intended to replace advice given to you by your health care provider. Make sure you discuss any questions you have with your health care provider. Document Revised: 01/10/2023 Document Reviewed: 01/06/2022 Elsevier Patient Education  2024 Else[employer] Inc.How to Use an Incentive Spirometer An incentive spirometer is a tool that measures how well you are filling your lungs with each breath. Learning to take long, deep breaths using this tool can help you keep your lungs clear and active. This may help to reverse or lessen your chance of developing breathing (pulmonary) problems, especially infection. You may be asked to use a spirometer: After a surgery. If you have a lung problem or a history of smoking. After a  long period of time when you have been unable to move or be active. If the spirometer includes an indicator to show the highest number that you have reached, your health care provider or respiratory therapist will help you set a goal. Keep a log of your progress as told by your health care provider. What are the risks? Breathing too quickly may cause dizziness or cause you to pass out. Take your time so you do not get dizzy or light-headed. If you are in pain, you may need to take pain medicine before doing incentive spirometry. It is harder to take a deep breath if you are having pain. How to use your incentive spirometer  Sit up on the edge of your bed or on a chair. Hold the incentive spirometer so that it is in an upright position. Before you use the spirometer, breathe out normally. Place the mouthpiece in your mouth. Make sure your lips are closed tightly around it. Breathe in slowly and as deeply as you can through your mouth, causing the piston or the ball to rise toward the top of the chamber. Hold your breath for 3-5 seconds, or for as long as possible. If the spirometer includes a coach indicator, use this to guide you in breathing. Slow down your breathing if the indicator goes above the marked areas. Remove the mouthpiece from your mouth and breathe out normally. The piston or ball will return to the bottom of the chamber. Rest for a few seconds, then repeat the steps 10 or more times. Take your time and take a few normal breaths between deep breaths so that you do not get dizzy or light-headed. Do this every 1-2 hours when you are awake. If the spirometer includes a goal marker to show the highest  number you have reached (best effort), use this as a goal to work toward during each repetition. After each set of 10 deep breaths, cough a few times. This will help to make sure that your lungs are clear. If you have an incision on your chest or abdomen from surgery, place a pillow or a  rolled-up towel firmly against the incision when you cough. This can help to reduce pain while taking deep breaths and coughing. General tips When you are able to get out of bed: Walk around often. Continue to take deep breaths and cough in order to clear your lungs. Keep using the incentive spirometer until your health care provider says it is okay to stop using it. If you have been in the hospital, you may be told to keep using the spirometer at home. Contact a health care provider if: You are having difficulty using the spirometer. You have trouble using the spirometer as often as instructed. Your pain medicine is not giving enough relief for you to use the spirometer as told. You have a fever. Get help right away if: You develop shortness of breath. You develop a cough with bloody mucus from the lungs. You have fluid or blood coming from an incision site after you cough. Summary An incentive spirometer is a tool that can help you learn to take long, deep breaths to keep your lungs clear and active. You may be asked to use a spirometer after a surgery, if you have a lung problem or a history of smoking, or if you have been inactive for a long period of time. Use your incentive spirometer as instructed every 1-2 hours while you are awake. If you have an incision on your chest or abdomen, place a pillow or a rolled-up towel firmly against your incision when you cough. This will help to reduce pain. Get help right away if you have shortness of breath, you cough up bloody mucus, or blood comes from your incision when you cough. This information is not intended to replace advice given to you by your health care provider. Make sure you discuss any questions you have with your health care provider. Document Revised: 05/05/2023 Document Reviewed: 05/05/2023 Elsevier Patient Education  2024 [employer] Inc.General Anesthesia, Adult, Care After The following information offers guidance on how to care  for yourself after your procedure. Your health care provider may also give you more specific instructions. If you have problems or questions, contact your health care provider. What can I expect after the procedure? After the procedure, it is common for people to: Have pain or discomfort at the IV site. Have nausea or vomiting. Have a sore throat or hoarseness. Have trouble concentrating. Feel cold or chills. Feel weak, sleepy, or tired (fatigue). Have soreness and body aches. These can affect parts of the body that were not involved in surgery. Follow these instructions at home: For the time period you were told by your health care provider:  Rest. Do not participate in activities where you could fall or become injured. Do not drive or use machinery. Do not drink alcohol. Do not take sleeping pills or medicines that cause drowsiness. Do not make important decisions or sign legal documents. Do not take care of children on your own. General instructions Drink enough fluid to keep your urine pale yellow. If you have sleep apnea, surgery and certain medicines can increase your risk for breathing problems. Follow instructions from your health care provider about wearing your sleep device: Anytime you  are sleeping, including during daytime naps. While taking prescription pain medicines, sleeping medicines, or medicines that make you drowsy. Return to your normal activities as told by your health care provider. Ask your health care provider what activities are safe for you. Take over-the-counter and prescription medicines only as told by your health care provider. Do not use any products that contain nicotine  or tobacco. These products include cigarettes, chewing tobacco, and vaping devices, such as e-cigarettes. These can delay incision healing after surgery. If you need help quitting, ask your health care provider. Contact a health care provider if: You have nausea or vomiting that does not  get better with medicine. You vomit every time you eat or drink. You have pain that does not get better with medicine. You cannot urinate or have bloody urine. You develop a skin rash. You have a fever. Get help right away if: You have trouble breathing. You have chest pain. You vomit blood. These symptoms may be an emergency. Get help right away. Call 911. Do not wait to see if the symptoms will go away. Do not drive yourself to the hospital. Summary After the procedure, it is common to have a sore throat, hoarseness, nausea, vomiting, or to feel weak, sleepy, or fatigue. For the time period you were told by your health care provider, do not drive or use machinery. Get help right away if you have difficulty breathing, have chest pain, or vomit blood. These symptoms may be an emergency. This information is not intended to replace advice given to you by your health care provider. Make sure you discuss any questions you have with your health care provider. Document Revised: 09/24/2021 Document Reviewed: 09/24/2021 Elsevier Patient Education  2024 [employer].

## 2024-03-01 ENCOUNTER — Encounter (HOSPITAL_COMMUNITY): Payer: Self-pay

## 2024-03-01 ENCOUNTER — Encounter (HOSPITAL_COMMUNITY)
Admission: RE | Admit: 2024-03-01 | Discharge: 2024-03-01 | Disposition: A | Discharge status: Home / Self Care | Source: Ambulatory Visit | Attending: General Surgery | Admitting: General Surgery

## 2024-03-01 VITALS — BP 124/80 | HR 72 | Resp 18 | Ht 71.0 in | Wt 161.4 lb

## 2024-03-01 DIAGNOSIS — F17200 Nicotine dependence, unspecified, uncomplicated: Secondary | ICD-10-CM | POA: Insufficient documentation

## 2024-03-01 DIAGNOSIS — F1490 Cocaine use, unspecified, uncomplicated: Secondary | ICD-10-CM

## 2024-03-01 DIAGNOSIS — Z01818 Encounter for other preprocedural examination: Secondary | ICD-10-CM | POA: Diagnosis not present

## 2024-03-01 DIAGNOSIS — F1410 Cocaine abuse, uncomplicated: Secondary | ICD-10-CM | POA: Insufficient documentation

## 2024-03-01 LAB — CBC WITH DIFFERENTIAL/PLATELET
Abs Immature Granulocytes: 0.05 K/uL (ref 0.00–0.07)
Basophils Absolute: 0.1 K/uL (ref 0.0–0.1)
Basophils Relative: 1 %
Eosinophils Absolute: 0.1 K/uL (ref 0.0–0.5)
Eosinophils Relative: 1 %
HCT: 40.6 % (ref 39.0–52.0)
Hemoglobin: 13.4 g/dL (ref 13.0–17.0)
Immature Granulocytes: 1 %
Lymphocytes Relative: 40 %
Lymphs Abs: 3.4 K/uL (ref 0.7–4.0)
MCH: 29.5 pg (ref 26.0–34.0)
MCHC: 33 g/dL (ref 30.0–36.0)
MCV: 89.4 fL (ref 80.0–100.0)
Monocytes Absolute: 0.7 K/uL (ref 0.1–1.0)
Monocytes Relative: 9 %
Neutro Abs: 4.2 K/uL (ref 1.7–7.7)
Neutrophils Relative %: 48 %
Platelets: 226 K/uL (ref 150–400)
RBC: 4.54 MIL/uL (ref 4.22–5.81)
RDW: 13.7 % (ref 11.5–15.5)
WBC: 8.5 K/uL (ref 4.0–10.5)
nRBC: 0 % (ref 0.0–0.2)

## 2024-03-01 LAB — TYPE AND SCREEN
ABO/RH(D): A POS
Antibody Screen: NEGATIVE

## 2024-03-01 LAB — BASIC METABOLIC PANEL WITH GFR
Anion gap: 10 (ref 5–15)
BUN: 13 mg/dL (ref 8–23)
CO2: 24 mmol/L (ref 22–32)
Calcium: 8.8 mg/dL — ABNORMAL LOW (ref 8.9–10.3)
Chloride: 104 mmol/L (ref 98–111)
Creatinine, Ser: 0.9 mg/dL (ref 0.61–1.24)
GFR, Estimated: >60 mL/min (ref >60)
Glucose, Bld: 82 mg/dL (ref 70–99)
Potassium: 3.7 mmol/L (ref 3.5–5.1)
Sodium: 138 mmol/L (ref 135–145)

## 2024-03-01 LAB — RAPID URINE DRUG SCREEN, HOSP PERFORMED
Amphetamines: NOT DETECTED
Barbiturates: NOT DETECTED
Benzodiazepines: NOT DETECTED
Cocaine: NOT DETECTED
Opiates: NOT DETECTED
Tetrahydrocannabinol: NOT DETECTED

## 2024-03-01 LAB — SURGICAL PCR SCREEN
MRSA, PCR: NEGATIVE
Staphylococcus aureus: NEGATIVE

## 2024-03-05 ENCOUNTER — Encounter (HOSPITAL_COMMUNITY): Payer: Self-pay | Admitting: General Surgery

## 2024-03-05 ENCOUNTER — Ambulatory Visit (HOSPITAL_COMMUNITY): Admitting: Anesthesiology

## 2024-03-05 ENCOUNTER — Ambulatory Visit (HOSPITAL_BASED_OUTPATIENT_CLINIC_OR_DEPARTMENT_OTHER)
Admission: RE | Admit: 2024-03-05 | Discharge: 2024-03-05 | Disposition: A | Discharge status: Home / Self Care | Source: Home / Self Care | Attending: General Surgery | Admitting: General Surgery

## 2024-03-05 ENCOUNTER — Ambulatory Visit (HOSPITAL_BASED_OUTPATIENT_CLINIC_OR_DEPARTMENT_OTHER): Admitting: Anesthesiology

## 2024-03-05 ENCOUNTER — Encounter (HOSPITAL_COMMUNITY)
Admission: RE | Disposition: A | Discharge status: Home / Self Care | Payer: Self-pay | Source: Home / Self Care | Attending: General Surgery

## 2024-03-05 ENCOUNTER — Other Ambulatory Visit: Payer: Self-pay

## 2024-03-05 DIAGNOSIS — K5730 Diverticulosis of large intestine without perforation or abscess without bleeding: Secondary | ICD-10-CM | POA: Insufficient documentation

## 2024-03-05 DIAGNOSIS — F17210 Nicotine dependence, cigarettes, uncomplicated: Secondary | ICD-10-CM | POA: Insufficient documentation

## 2024-03-05 DIAGNOSIS — K56699 Other intestinal obstruction unspecified as to partial versus complete obstruction: Secondary | ICD-10-CM | POA: Insufficient documentation

## 2024-03-05 DIAGNOSIS — F17200 Nicotine dependence, unspecified, uncomplicated: Secondary | ICD-10-CM | POA: Diagnosis not present

## 2024-03-05 DIAGNOSIS — Z1211 Encounter for screening for malignant neoplasm of colon: Secondary | ICD-10-CM | POA: Insufficient documentation

## 2024-03-05 DIAGNOSIS — Z8719 Personal history of other diseases of the digestive system: Secondary | ICD-10-CM | POA: Insufficient documentation

## 2024-03-05 DIAGNOSIS — Z538 Procedure and treatment not carried out for other reasons: Secondary | ICD-10-CM | POA: Insufficient documentation

## 2024-03-05 HISTORY — PX: FLEXIBLE SIGMOIDOSCOPY: SHX5431

## 2024-03-05 SURGERY — SIGMOIDOSCOPY, FLEXIBLE
Anesthesia: General

## 2024-03-05 MED ORDER — PROPOFOL 10 MG/ML IV BOLUS
INTRAVENOUS | Status: DC | PRN
  Administered 2024-03-05: 50 mg via INTRAVENOUS
  Administered 2024-03-05: 100 mg via INTRAVENOUS

## 2024-03-05 MED ORDER — PROPOFOL 500 MG/50ML IV EMUL
INTRAVENOUS | Status: DC | PRN
  Administered 2024-03-05: 200 ug/kg/min via INTRAVENOUS

## 2024-03-05 MED ORDER — LACTATED RINGERS IV SOLN: INTRAVENOUS | Status: DC

## 2024-03-05 NOTE — Interval H&P Note (Signed)
 History and Physical Interval Note:  03/05/2024 7:14 AM  James Blake Pouch  has presented today for surgery, with the diagnosis of SCREENING FOR COLON CANCER.  The various methods of treatment have been discussed with the patient and family. After consideration of risks, benefits and other options for treatment, the patient has consented to  Procedure(s) with comments: COLONOSCOPY (N/A) - W/ PROPFOL as a surgical intervention.  The patient's history has been reviewed, patient examined, no change in status, stable for surgery.  I have reviewed the patient's chart and labs.  Questions were answered to the patient's satisfaction.     [name]

## 2024-03-05 NOTE — Anesthesia Postprocedure Evaluation (Signed)
 Anesthesia Post Note  Patient: James Blake  Procedure(s) Performed: KINGST[place] SIDE  Patient location during evaluation: Phase II Anesthesia Type: General Level of consciousness: awake Pain management: pain level controlled Vital Signs Assessment: post-procedure vital signs reviewed and stable Respiratory status: spontaneous breathing and respiratory function stable Cardiovascular status: blood pressure returned to baseline and stable Postop Assessment: no headache and no apparent nausea or vomiting Anesthetic complications: no Comments: Late entry   No notable events documented.   Last Vitals:  Vitals:   03/05/24 0657 03/05/24 0759  BP: 124/86 101/66  Pulse: 72 69  Resp: 15 19  Temp: 36.8 C 36.4 C  SpO2: 97% 97%    Last Pain:  Vitals:   03/05/24 0801  TempSrc:   PainSc: 0-No pain                 Yvonna JINNY Bosworth

## 2024-03-05 NOTE — Transfer of Care (Signed)
 Immediate Anesthesia Transfer of Care Note  Patient: James Blake  Procedure(s) Performed: K[place]ST[place] SIDE  Patient Location: Short Stay  Anesthesia Type:General  Level of Consciousness: awake, alert , oriented, and patient cooperative  Airway & Oxygen Therapy: Patient Spontanous Breathing  Post-op Assessment: Report given to RN, Post -op Vital signs reviewed and stable, and Patient moving all extremities X 4  Post vital signs: Reviewed and stable  Last Vitals:  Vitals Value Taken Time  BP 101/66 03/05/24 07:59  Temp 36.4 C 03/05/24 07:59  Pulse 69 03/05/24 07:59  Resp 19 03/05/24 07:59  SpO2 97 % 03/05/24 07:59    Last Pain:  Vitals:   03/05/24 0801  TempSrc:   PainSc: 0-No pain      Patients Stated Pain Goal: 7 (03/05/24 0657)  Complications: No notable events documented.

## 2024-03-05 NOTE — Anesthesia Preprocedure Evaluation (Signed)
 Anesthesia Evaluation  Patient identified by MRN, date of birth, ID band Patient awake    Reviewed: Allergy & Precautions, H&P , NPO status , Patient's Chart, lab work & pertinent test results, reviewed documented beta blocker date and time   Airway Mallampati: II  TM Distance: >3 FB Neck ROM: full    Dental no notable dental hx.    Pulmonary neg pulmonary ROS, Current Smoker and Patient abstained from smoking.   Pulmonary exam normal breath sounds clear to auscultation       Cardiovascular Exercise Tolerance: Good hypertension, negative cardio ROS  Rhythm:regular Rate:Normal     Neuro/Psych  PSYCHIATRIC DISORDERS      negative neurological ROS     GI/Hepatic negative GI ROS, Neg liver ROS,,,  Endo/Other  negative endocrine ROS    Renal/GU negative Renal ROS  negative genitourinary   Musculoskeletal   Abdominal   Peds  Hematology negative hematology ROS (+)   Anesthesia Other Findings   Reproductive/Obstetrics negative OB ROS                              Anesthesia Physical Anesthesia Plan  ASA: 2  Anesthesia Plan: General   Post-op Pain Management:    Induction:   PONV Risk Score and Plan: Propofol  infusion  Airway Management Planned:   Additional Equipment:   Intra-op Plan:   Post-operative Plan:   Informed Consent: I have reviewed the patients History and Physical, chart, labs and discussed the procedure including the risks, benefits and alternatives for the proposed anesthesia with the patient or authorized representative who has indicated his/her understanding and acceptance.     Dental Advisory Given  Plan Discussed with: CRNA  Anesthesia Plan Comments:         Anesthesia Quick Evaluation

## 2024-03-05 NOTE — Op Note (Signed)
 [facility] Patient Name: James Blake Procedure Date: 03/05/2024 7:13 AM MRN: [id number] Date of Birth: 10-May-1961 Attending MD: [name] , MD, [id number] CSN: [id number] Age: 63 Admit Type: Outpatient Procedure:                Colonoscopy Indications:              Screening for colorectal malignant neoplasm Providers:                [name], MD, Madelin Hunter, RN, Dorcas Lenis,                            Technician Referring MD:              Medicines:                Propofol  per Anesthesia Complications:            No immediate complications. Estimated Blood Loss:     Estimated blood loss: none. Procedure:                Pre-Anesthesia Assessment:                           - Prior to the procedure, a History and Physical                            was performed, and patient medications and                            allergies were reviewed. The patient is competent.                            The risks and benefits of the procedure and the                            sedation options and risks were discussed with the                            patient. All questions were answered and informed                            consent was obtained. Patient identification and                            proposed procedure were verified by the physician,                            the nurse, the anesthetist and the technician                            [Verification]. Mental Status Examination: alert                            and oriented. Airway Examination: normal                            oropharyngeal airway and  neck mobility. Respiratory                            Examination: clear to auscultation. CV Examination:                            RRR, no murmurs, no S3 or S4. Prophylactic                            Antibiotics: The patient does not require                            prophylactic antibiotics. Prior Anticoagulants: The                            patient has  taken no anticoagulant or antiplatelet                            agents. ASA Grade Assessment: II - A patient with                            mild systemic disease. After reviewing the risks                            and benefits, the patient was deemed in                            satisfactory condition to undergo the procedure.                            The anesthesia plan was to use deep sedation /                            analgesia. Immediately prior to administration of                            medications, the patient was re-assessed for                            adequacy to receive sedatives. The heart rate,                            respiratory rate, oxygen saturations, blood                            pressure, adequacy of pulmonary ventilation, and                            response to care were monitored throughout the                            procedure. The physical status of the patient was  re-assessed after the procedure.                           After obtaining informed consent, the colonoscope                            was passed under direct vision. Throughout the                            procedure, the patient's blood pressure, pulse, and                            oxygen saturations were monitored continuously. The                            PCF-[id number] ([id number]) Peds Colon was introduced                            through the anus and advanced to the the sigmoid                            colon. The quality of the bowel preparation was                            adequate. The total duration of the procedure was                            22 minutes. The colonoscopy was technically                            difficult and complex due to multiple diverticula                            in the colon. Scope In: 7:33:55 AM Scope Out: 7:55:01 AM Total Procedure Duration: 0 hours 21 minutes 6 seconds  Findings:      The perianal and  digital rectal examinations were normal.      A few medium-mouthed diverticula were found in the mid sigmoid colon.       Unable to advance past this point due to tortuosity and narrowing due to       previous diverticulitis. Impression:               - Diverticulosis in the mid sigmoid colon.                           - No specimens collected. Moderate Sedation:      Moderate (conscious) sedation was administered by the nurse and       supervised by the endoscopist. The patient's oxygen saturation, heart       rate, blood pressure and response to care were monitored. Recommendation:           - Written discharge instructions were provided to                            the patient.                           -  The signs and symptoms of potential delayed                            complications were discussed with the patient.                           - Patient has a contact number available for                            emergencies.                           - Return to normal activities tomorrow.                           - Resume previous diet.                           - Continue present medications.                           - Repeat colonoscopy at appointment to be scheduled                            for screening purposes. Procedure Code(s):        --- Professional ---                           [phone], 53, Colonoscopy, flexible; diagnostic,                            including collection of specimen(s) by brushing or                            washing, when performed (separate procedure) Diagnosis Code(s):        --- Professional ---                           K57.30, Diverticulosis of large intestine without                            perforation or abscess without bleeding                           Z12.11, Encounter for screening for malignant                            neoplasm of colon CPT copyright 2022 [employer]. All rights reserved. The codes documented  in this report are preliminary and upon coder review may  be revised to meet current compliance requirements. [name], MD [name], MD 03/05/2024 8:02:10 AM This report has been signed electronically. Number of Addenda: 0

## 2024-03-06 ENCOUNTER — Inpatient Hospital Stay (HOSPITAL_COMMUNITY)
Admission: RE | Admit: 2024-03-06 | Discharge: 2024-03-09 | DRG: 331 | Disposition: A | Discharge status: Home / Self Care | Attending: General Surgery | Admitting: General Surgery

## 2024-03-06 ENCOUNTER — Inpatient Hospital Stay (HOSPITAL_COMMUNITY): Admitting: Anesthesiology

## 2024-03-06 ENCOUNTER — Encounter (HOSPITAL_COMMUNITY): Payer: Self-pay | Admitting: General Surgery

## 2024-03-06 ENCOUNTER — Encounter (HOSPITAL_COMMUNITY)
Admission: RE | Disposition: A | Discharge status: Home / Self Care | Payer: Self-pay | Source: Home / Self Care | Attending: General Surgery

## 2024-03-06 ENCOUNTER — Other Ambulatory Visit: Payer: Self-pay

## 2024-03-06 DIAGNOSIS — Z833 Family history of diabetes mellitus: Secondary | ICD-10-CM

## 2024-03-06 DIAGNOSIS — F1490 Cocaine use, unspecified, uncomplicated: Secondary | ICD-10-CM | POA: Diagnosis not present

## 2024-03-06 DIAGNOSIS — K635 Polyp of colon: Secondary | ICD-10-CM | POA: Diagnosis not present

## 2024-03-06 DIAGNOSIS — K5720 Diverticulitis of large intestine with perforation and abscess without bleeding: Secondary | ICD-10-CM | POA: Diagnosis not present

## 2024-03-06 DIAGNOSIS — F17210 Nicotine dependence, cigarettes, uncomplicated: Secondary | ICD-10-CM | POA: Diagnosis not present

## 2024-03-06 DIAGNOSIS — K5732 Diverticulitis of large intestine without perforation or abscess without bleeding: Principal | ICD-10-CM | POA: Diagnosis present

## 2024-03-06 DIAGNOSIS — Z9049 Acquired absence of other specified parts of digestive tract: Secondary | ICD-10-CM

## 2024-03-06 DIAGNOSIS — K660 Peritoneal adhesions (postprocedural) (postinfection): Secondary | ICD-10-CM | POA: Diagnosis present

## 2024-03-06 DIAGNOSIS — Z8249 Family history of ischemic heart disease and other diseases of the circulatory system: Secondary | ICD-10-CM | POA: Diagnosis not present

## 2024-03-06 DIAGNOSIS — Z01818 Encounter for other preprocedural examination: Principal | ICD-10-CM

## 2024-03-06 DIAGNOSIS — K5730 Diverticulosis of large intestine without perforation or abscess without bleeding: Secondary | ICD-10-CM | POA: Diagnosis not present

## 2024-03-06 HISTORY — PX: PARTIAL COLECTOMY: SHX5273

## 2024-03-06 SURGERY — COLECTOMY, PARTIAL
Anesthesia: General

## 2024-03-06 MED ORDER — PROPOFOL 10 MG/ML IV BOLUS
INTRAVENOUS | Status: AC
  Filled 2024-03-06: qty 20

## 2024-03-06 MED ORDER — ONDANSETRON HCL 4 MG/2ML IJ SOLN
INTRAMUSCULAR | Status: DC | PRN
Start: 2024-03-06 — End: 2024-03-06
  Administered 2024-03-06: 4 mg via INTRAVENOUS

## 2024-03-06 MED ORDER — BUPIVACAINE HCL (PF) 0.5 % IJ SOLN
INTRAMUSCULAR | Status: AC
  Filled 2024-03-06: qty 30

## 2024-03-06 MED ORDER — SODIUM CHLORIDE 0.9 % IV SOLN
2.0000 g | INTRAVENOUS | Status: AC
  Administered 2024-03-06: 2 g via INTRAVENOUS
  Filled 2024-03-06: qty 2

## 2024-03-06 MED ORDER — FENTANYL CITRATE (PF) 250 MCG/5ML IJ SOLN
INTRAMUSCULAR | Status: DC | PRN
  Administered 2024-03-06 (×2): 100 ug via INTRAVENOUS
  Administered 2024-03-06: 50 ug via INTRAVENOUS

## 2024-03-06 MED ORDER — MIDAZOLAM HCL 2 MG/2ML IJ SOLN
INTRAMUSCULAR | Status: DC | PRN
  Administered 2024-03-06: 2 mg via INTRAVENOUS

## 2024-03-06 MED ORDER — CHLORHEXIDINE GLUCONATE CLOTH 2 % EX PADS: 6.0000 | MEDICATED_PAD | Freq: Once | CUTANEOUS | Status: DC

## 2024-03-06 MED ORDER — KETOROLAC TROMETHAMINE 30 MG/ML IJ SOLN
30.0000 mg | Freq: Four times a day (QID) | INTRAMUSCULAR | Status: DC | PRN
Start: 2024-03-06 — End: 2024-03-09
  Administered 2024-03-07 (×2): 30 mg via INTRAVENOUS
  Filled 2024-03-06 (×2): qty 1

## 2024-03-06 MED ORDER — ROCURONIUM BROMIDE 10 MG/ML (PF) SYRINGE
PREFILLED_SYRINGE | INTRAVENOUS | Status: DC | PRN
  Administered 2024-03-06: 60 mg via INTRAVENOUS

## 2024-03-06 MED ORDER — DEXAMETHASONE SODIUM PHOSPHATE 10 MG/ML IJ SOLN
INTRAMUSCULAR | Status: AC
  Filled 2024-03-06: qty 1

## 2024-03-06 MED ORDER — HYDROMORPHONE HCL 1 MG/ML IJ SOLN
INTRAMUSCULAR | Status: DC | PRN
  Administered 2024-03-06 (×2): .5 mg via INTRAVENOUS

## 2024-03-06 MED ORDER — CHLORHEXIDINE GLUCONATE 0.12 % MT SOLN
15.0000 mL | Freq: Once | OROMUCOSAL | Status: AC
  Administered 2024-03-06: 15 mL via OROMUCOSAL

## 2024-03-06 MED ORDER — LIDOCAINE 2% (20 MG/ML) 5 ML SYRINGE
INTRAMUSCULAR | Status: DC | PRN
  Administered 2024-03-06: 100 mg via INTRAVENOUS

## 2024-03-06 MED ORDER — SIMETHICONE 80 MG PO CHEW
40.0000 mg | CHEWABLE_TABLET | Freq: Four times a day (QID) | ORAL | Status: DC | PRN
  Administered 2024-03-06 – 2024-03-07 (×3): 40 mg via ORAL
  Filled 2024-03-06 (×3): qty 1

## 2024-03-06 MED ORDER — OXYCODONE HCL 5 MG/5ML PO SOLN: 5.0000 mg | Freq: Once | ORAL | Status: DC | PRN

## 2024-03-06 MED ORDER — ONDANSETRON HCL 4 MG/2ML IJ SOLN: 4.0000 mg | Freq: Once | INTRAMUSCULAR | Status: DC | PRN

## 2024-03-06 MED ORDER — 0.9 % SODIUM CHLORIDE (POUR BTL) OPTIME
TOPICAL | Status: DC | PRN
  Administered 2024-03-06: 1000 mL

## 2024-03-06 MED ORDER — PROPOFOL 500 MG/50ML IV EMUL
INTRAVENOUS | Status: AC
  Filled 2024-03-06: qty 100

## 2024-03-06 MED ORDER — SUGAMMADEX SODIUM 200 MG/2ML IV SOLN
INTRAVENOUS | Status: DC | PRN
  Administered 2024-03-06: 400 mg via INTRAVENOUS

## 2024-03-06 MED ORDER — ORAL CARE MOUTH RINSE: 15.0000 mL | Freq: Once | OROMUCOSAL | Status: AC

## 2024-03-06 MED ORDER — LACTATED RINGERS IV SOLN: INTRAVENOUS | Status: DC

## 2024-03-06 MED ORDER — ENOXAPARIN SODIUM 40 MG/0.4ML IJ SOSY
40.0000 mg | PREFILLED_SYRINGE | Freq: Once | INTRAMUSCULAR | Status: AC
  Administered 2024-03-06: 40 mg via SUBCUTANEOUS
  Filled 2024-03-06: qty 0.4

## 2024-03-06 MED ORDER — LIDOCAINE 2% (20 MG/ML) 5 ML SYRINGE
INTRAMUSCULAR | Status: AC
Start: 2024-03-06 — End: 2024-03-06
  Filled 2024-03-06: qty 5

## 2024-03-06 MED ORDER — POVIDONE-IODINE 10 % OINT PACKET
TOPICAL_OINTMENT | CUTANEOUS | Status: DC | PRN
  Administered 2024-03-06: 1 via TOPICAL

## 2024-03-06 MED ORDER — ENOXAPARIN SODIUM 40 MG/0.4ML IJ SOSY
40.0000 mg | PREFILLED_SYRINGE | INTRAMUSCULAR | Status: DC
  Administered 2024-03-07 – 2024-03-08 (×2): 40 mg via SUBCUTANEOUS
  Filled 2024-03-06 (×3): qty 0.4

## 2024-03-06 MED ORDER — PROPOFOL 10 MG/ML IV BOLUS
INTRAVENOUS | Status: DC | PRN
  Administered 2024-03-06: 200 mg via INTRAVENOUS

## 2024-03-06 MED ORDER — HYDROMORPHONE HCL 1 MG/ML IJ SOLN
INTRAMUSCULAR | Status: AC
  Filled 2024-03-06: qty 0.5

## 2024-03-06 MED ORDER — OXYCODONE HCL 5 MG PO TABS: 5.0000 mg | ORAL_TABLET | Freq: Once | ORAL | Status: DC | PRN

## 2024-03-06 MED ORDER — EPHEDRINE 5 MG/ML INJ
INTRAVENOUS | Status: AC
Start: 2024-03-06 — End: 2024-03-06
  Filled 2024-03-06: qty 5

## 2024-03-06 MED ORDER — ONDANSETRON HCL 4 MG/2ML IJ SOLN
INTRAMUSCULAR | Status: AC
  Filled 2024-03-06: qty 2

## 2024-03-06 MED ORDER — FENTANYL CITRATE PF 50 MCG/ML IJ SOSY
25.0000 ug | PREFILLED_SYRINGE | INTRAMUSCULAR | Status: DC | PRN
  Administered 2024-03-06 (×2): 50 ug via INTRAVENOUS
  Filled 2024-03-06 (×2): qty 1

## 2024-03-06 MED ORDER — ONDANSETRON 4 MG PO TBDP
4.0000 mg | ORAL_TABLET | Freq: Four times a day (QID) | ORAL | Status: DC | PRN
Start: 2024-03-06 — End: 2024-03-09

## 2024-03-06 MED ORDER — MIDAZOLAM HCL 2 MG/2ML IJ SOLN
INTRAMUSCULAR | Status: AC
  Filled 2024-03-06: qty 2

## 2024-03-06 MED ORDER — BUPIVACAINE HCL (PF) 0.5 % IJ SOLN
INTRAMUSCULAR | Status: DC | PRN
  Administered 2024-03-06: 30 mL

## 2024-03-06 MED ORDER — SODIUM CHLORIDE 0.9 % IV SOLN: INTRAVENOUS | Status: AC

## 2024-03-06 MED ORDER — POVIDONE-IODINE 10 % EX OINT
TOPICAL_OINTMENT | CUTANEOUS | Status: AC
  Filled 2024-03-06: qty 28.4

## 2024-03-06 MED ORDER — PROPOFOL 10 MG/ML IV BOLUS
INTRAVENOUS | Status: AC
Start: 2024-03-06 — End: 2024-03-06
  Filled 2024-03-06: qty 20

## 2024-03-06 MED ORDER — HYDROMORPHONE HCL 1 MG/ML IJ SOLN
1.0000 mg | INTRAMUSCULAR | Status: DC | PRN
  Administered 2024-03-06 – 2024-03-07 (×5): 1 mg via INTRAVENOUS
  Filled 2024-03-06 (×5): qty 1

## 2024-03-06 MED ORDER — OXYCODONE HCL 5 MG PO TABS
5.0000 mg | ORAL_TABLET | ORAL | Status: DC | PRN
  Administered 2024-03-06 – 2024-03-09 (×11): 10 mg via ORAL
  Filled 2024-03-06 (×11): qty 2

## 2024-03-06 MED ORDER — KETOROLAC TROMETHAMINE 30 MG/ML IJ SOLN
30.0000 mg | Freq: Four times a day (QID) | INTRAMUSCULAR | Status: AC
  Administered 2024-03-06: 30 mg via INTRAVENOUS
  Filled 2024-03-06: qty 1

## 2024-03-06 MED ORDER — ONDANSETRON HCL 4 MG/2ML IJ SOLN: 4.0000 mg | Freq: Four times a day (QID) | INTRAMUSCULAR | Status: DC | PRN

## 2024-03-06 MED ORDER — HEMOSTATIC AGENTS (NO CHARGE) OPTIME
TOPICAL | Status: DC | PRN
  Administered 2024-03-06: 1 via TOPICAL

## 2024-03-06 MED ORDER — FENTANYL CITRATE (PF) 250 MCG/5ML IJ SOLN
INTRAMUSCULAR | Status: AC
  Filled 2024-03-06: qty 5

## 2024-03-06 MED ORDER — ALVIMOPAN 12 MG PO CAPS
12.0000 mg | ORAL_CAPSULE | ORAL | Status: AC
  Administered 2024-03-06: 12 mg via ORAL
  Filled 2024-03-06: qty 1

## 2024-03-06 MED ORDER — SUGAMMADEX SODIUM 200 MG/2ML IV SOLN
INTRAVENOUS | Status: AC
  Filled 2024-03-06: qty 4

## 2024-03-06 MED ORDER — DEXMEDETOMIDINE HCL IN NACL 80 MCG/20ML IV SOLN
INTRAVENOUS | Status: DC | PRN
  Administered 2024-03-06: 20 ug via INTRAVENOUS

## 2024-03-06 MED ORDER — ACETAMINOPHEN 650 MG RE SUPP: 650.0000 mg | Freq: Four times a day (QID) | RECTAL | Status: DC | PRN

## 2024-03-06 MED ORDER — ACETAMINOPHEN 325 MG PO TABS
650.0000 mg | ORAL_TABLET | Freq: Four times a day (QID) | ORAL | Status: DC | PRN
  Administered 2024-03-08: 650 mg via ORAL
  Filled 2024-03-06: qty 2

## 2024-03-06 MED ORDER — ALVIMOPAN 12 MG PO CAPS
12.0000 mg | ORAL_CAPSULE | Freq: Two times a day (BID) | ORAL | Status: DC
  Administered 2024-03-07 (×2): 12 mg via ORAL
  Filled 2024-03-06 (×3): qty 1

## 2024-03-06 MED ORDER — DEXAMETHASONE SODIUM PHOSPHATE 10 MG/ML IJ SOLN
INTRAMUSCULAR | Status: DC | PRN
  Administered 2024-03-06: 10 mg via INTRAVENOUS

## 2024-03-06 MED ORDER — ROCURONIUM BROMIDE 10 MG/ML (PF) SYRINGE
PREFILLED_SYRINGE | INTRAVENOUS | Status: AC
  Filled 2024-03-06: qty 30

## 2024-03-06 SURGICAL SUPPLY — 33 items
COVER LIGHT HANDLE (MISCELLANEOUS) IMPLANT
DRSG OPSITE POSTOP 4X8 (GAUZE/BANDAGES/DRESSINGS) IMPLANT
ELECTRODE REM PT RTRN 9FT ADLT (ELECTROSURGICAL) ×1 IMPLANT
GLOVE BIOGEL PI IND STRL 7.0 (GLOVE) ×5 IMPLANT
GLOVE SURG SS PI 7.5 STRL IVOR (GLOVE) ×3 IMPLANT
GOWN STRL REUS W/TWL LRG LVL3 (GOWN DISPOSABLE) ×6 IMPLANT
HEMOSTAT SURGICEL 4X8 (HEMOSTASIS) IMPLANT
INST SET MAJOR GENERAL (KITS) ×1 IMPLANT
KIT TURNOVER KIT A (KITS) ×1 IMPLANT
LIGASURE IMPACT 36 18CM CVD LR (INSTRUMENTS) ×1 IMPLANT
MANIFOLD NEPTUNE II (INSTRUMENTS) ×1 IMPLANT
NDL HYPO 18GX1.5 BLUNT FILL (NEEDLE) ×1 IMPLANT
NDL HYPO 21X1.5 SAFETY (NEEDLE) ×1 IMPLANT
NEEDLE HYPO 18GX1.5 BLUNT FILL (NEEDLE) ×1 IMPLANT
NEEDLE HYPO 21X1.5 SAFETY (NEEDLE) ×1 IMPLANT
NS IRRIG 1000ML POUR BTL (IV SOLUTION) ×2 IMPLANT
PACK COLON (CUSTOM PROCEDURE TRAY) ×1 IMPLANT
PAD ARMBOARD POSITIONER FOAM (MISCELLANEOUS) ×1 IMPLANT
PENCIL HANDSWITCHING (ELECTRODE) ×1 IMPLANT
POSITIONER HEAD 8X9X4 ADT (SOFTGOODS) ×1 IMPLANT
RELOAD STAPLE 75 3.8 BLU REG (ENDOMECHANICALS) IMPLANT
RETRACTOR WND ALEXIS-O 25 LRG (MISCELLANEOUS) IMPLANT
SHEET LAVH (DRAPES) IMPLANT
SPONGE T-LAP 18X18 ~~LOC~~+RFID (SPONGE) ×2 IMPLANT
STAPLER GUN LINEAR PROX 60 (STAPLE) ×1 IMPLANT
STAPLER PROXIMATE 75MM BLUE (STAPLE) IMPLANT
STAPLER VISISTAT (STAPLE) ×1 IMPLANT
SUT PDS AB 0 CTX 60 (SUTURE) IMPLANT
SUT SILK 2-0 18XBRD TIE 12 (SUTURE) IMPLANT
SUT SILK 3 0 SH CR/8 (SUTURE) ×1 IMPLANT
SWAB CULTURE ESWAB REG 1ML (MISCELLANEOUS) IMPLANT
TRAY FOLEY MTR SLVR 16FR STAT (SET/KITS/TRAYS/PACK) ×1 IMPLANT
YANKAUER SUCT BULB TIP 10FT TU (MISCELLANEOUS) ×1 IMPLANT

## 2024-03-06 NOTE — Plan of Care (Signed)

## 2024-03-06 NOTE — Anesthesia Procedure Notes (Signed)
 Procedure Name: Intubation Date/Time: 03/06/2024 11:03 AM  Performed by: Cordella Elvie HERO, CRNAPre-anesthesia Checklist: Patient identified, Emergency Drugs available, Suction available, Patient being monitored and Timeout performed Patient Re-evaluated:Patient Re-evaluated prior to induction Oxygen Delivery Method: Circle system utilized Preoxygenation: Pre-oxygenation with 100% oxygen Induction Type: IV induction Ventilation: Mask ventilation without difficulty Laryngoscope Size: Mac and 4 Grade View: Grade I Tube type: Oral Tube size: 7.5 mm Number of attempts: 1 Airway Equipment and Method: Stylet Placement Confirmation: ETT inserted through vocal cords under direct vision, positive ETCO2, CO2 detector and breath sounds checked- equal and bilateral Secured at: 23 cm Tube secured with: Tape Dental Injury: Teeth and Oropharynx as per pre-operative assessment

## 2024-03-06 NOTE — Transfer of Care (Signed)
 Immediate Anesthesia Transfer of Care Note  Patient: James Blake  Procedure(s) Performed: COLECTOMY, PARTIAL  Patient Location: PACU  Anesthesia Type:General  Level of Consciousness: awake, alert , oriented, and patient cooperative  Airway & Oxygen Therapy: Patient Spontanous Breathing and Patient connected to face mask oxygen  Post-op Assessment: Report given to RN, Post -op Vital signs reviewed and stable, and Patient moving all extremities X 4  Post vital signs: Reviewed and stable  Last Vitals:  Vitals Value Taken Time  BP 129/99 03/06/24 13:54  Temp 36.7 C 03/06/24 13:54  Pulse 66 03/06/24 13:54  Resp 10 03/06/24 13:42  SpO2 99 % 03/06/24 13:54  Vitals shown include unfiled device data.  Last Pain:  Vitals:   03/06/24 1354  TempSrc: Axillary  PainSc: 8       Patients Stated Pain Goal: 6 (03/06/24 0912)  Complications: No notable events documented.

## 2024-03-06 NOTE — Interval H&P Note (Signed)
 History and Physical Interval Note:  03/06/2024 9:17 AM  James Blake  has presented today for surgery, with the diagnosis of DIVERTICULITIS OF COLON.  The various methods of treatment have been discussed with the patient and family. After consideration of risks, benefits and other options for treatment, the patient has consented to  Procedure(s): COLECTOMY, PARTIAL (N/A) as a surgical intervention.  The patient's history has been reviewed, patient examined, no change in status, stable for surgery.  I have reviewed the patient's chart and labs.  Questions were answered to the patient's satisfaction.     [name]

## 2024-03-06 NOTE — Op Note (Signed)
 Patient:  James Blake  DOB:  1960-08-21  MRN:  [id number]    Preop Diagnosis: History of sigmoid diverticulitis  Postop Diagnosis: Same  Procedure: Partial colectomy  Surgeon: [name], MD  Anes: General endotracheal  Indications: Patient is a 63 year old white male with a history of sigmoid diverticulitis with an abscess who has had multiple episodes in the past and now presents for an elective sigmoid colectomy.  The risks and benefits of the procedure including bleeding, infection, anastomotic leak, and the possibility of needing a blood transfusion were fully explained to the patient, who gave informed consent.  Procedure note: The patient was placed in the lithotomy position after induction of general endotracheal anesthesia.  The abdomen and perineum were prepped and draped using the usual sterile technique with Betadine  and ChloraPrep.  Surgical site confirmation was performed.  The lower midline incision was made from the umbilicus to the suprapubic region.  The peritoneal cavity was entered into without difficulty.  The colon was inspected approximately and no abnormal lesions were noted.  The sigmoid colon was mobilized along the line of Toldt.  The left ureter was identified.  The patient had an adhesed area of the mid sigmoid colon to the left pelvic wall.  While I was freeing this, a small abscess cavity was found.  Cultures were taken and sent to microbiology.  I was able to dissect the colon free from the left pelvic wall.  A GIA 75 stapler was placed across the proximal sigmoid colon and fired.  This was likewise done at the distal sigmoid colon.  A side-to-side Calot colotomy was performed using a GIA 75 stapler.  The colotomy was closed using a TA 60 stapler.  The staple line was bolstered using 3-0 silk Lembert sutures.  Surrounding omentum was used to cover the anastomosis.  A patent anastomosis was found.  The bowel was returned into the abdominal cavity in an  orderly fashion.  The abdomen was copiously irrigated with normal saline.  A small amount of Surgicel was placed in the left pelvis where the adhesion of the colon was noted.  All operating personnel then changed their gown and gloves.  A new set up was used for closure.  The midline fascia was reapproximated using a looped 0 PDS running suture.  0.5% Sensorcaine  was instilled into the surrounding wound.  The skin was closed using staples.  Betadine  ointment and a dry sterile dressing were applied.  All tape and needle counts were correct at the end of the procedure.  The patient was extubated in the operating room and transferred to PACU in stable condition.  Complications: None  EBL: 50 cc  Specimen: Sigmoid colon Culture of intra-abdominal abscess

## 2024-03-06 NOTE — Anesthesia Preprocedure Evaluation (Signed)
 Anesthesia Evaluation  Patient identified by MRN, date of birth, ID band Patient awake    Reviewed: Allergy & Precautions, H&P , NPO status , Patient's Chart, lab work & pertinent test results, reviewed documented beta blocker date and time   Airway Mallampati: II  TM Distance: >3 FB Neck ROM: full    Dental no notable dental hx.    Pulmonary neg pulmonary ROS, Current Smoker and Patient abstained from smoking.   Pulmonary exam normal breath sounds clear to auscultation       Cardiovascular Exercise Tolerance: Good hypertension, negative cardio ROS  Rhythm:regular Rate:Normal     Neuro/Psych  PSYCHIATRIC DISORDERS      negative neurological ROS     GI/Hepatic negative GI ROS, Neg liver ROS,,,  Endo/Other  negative endocrine ROS    Renal/GU negative Renal ROS  negative genitourinary   Musculoskeletal   Abdominal   Peds  Hematology negative hematology ROS (+)   Anesthesia Other Findings   Reproductive/Obstetrics negative OB ROS                              Anesthesia Physical Anesthesia Plan  ASA: 2  Anesthesia Plan: General and General ETT   Post-op Pain Management:    Induction:   PONV Risk Score and Plan: Ondansetron   Airway Management Planned:   Additional Equipment:   Intra-op Plan:   Post-operative Plan:   Informed Consent: I have reviewed the patients History and Physical, chart, labs and discussed the procedure including the risks, benefits and alternatives for the proposed anesthesia with the patient or authorized representative who has indicated his/her understanding and acceptance.     Dental Advisory Given  Plan Discussed with: CRNA  Anesthesia Plan Comments:         Anesthesia Quick Evaluation

## 2024-03-06 NOTE — TOC CM/SW Note (Signed)
 Transition of Care [facility]) - Inpatient Brief Assessment   Patient Details  Name: James Blake MRN: [id number] Date of Birth: [birth year]  Transition of Care [facility] CM/SW Contact:    Lucie Lunger, LCSWA Phone Number: 03/06/2024, 1:37 PM   Clinical Narrative: Transition of Care Department [facility]) has reviewed patient and no TOC needs have been identified at this time. We will continue to monitor patient advancement through interdiciplinary progression rounds. If new patient transition needs arise, please place a TOC consult.  Transition of Care Asessment: Insurance and Status: Insurance coverage has been reviewed Patient has primary care physician: Yes Home environment has been reviewed: From home Prior level of function:: Independent Prior/Current Home Services: No current home services Social Drivers of Health Review: SDOH reviewed no interventions necessary Readmission risk has been reviewed: Yes Transition of care needs: no transition of care needs at this time

## 2024-03-07 ENCOUNTER — Encounter (HOSPITAL_COMMUNITY): Payer: Self-pay | Admitting: General Surgery

## 2024-03-07 LAB — CBC
HCT: 37.8 % — ABNORMAL LOW (ref 39.0–52.0)
Hemoglobin: 12.2 g/dL — ABNORMAL LOW (ref 13.0–17.0)
MCH: 29.4 pg (ref 26.0–34.0)
MCHC: 32.3 g/dL (ref 30.0–36.0)
MCV: 91.1 fL (ref 80.0–100.0)
Platelets: 211 K/uL (ref 150–400)
RBC: 4.15 MIL/uL — ABNORMAL LOW (ref 4.22–5.81)
RDW: 13.5 % (ref 11.5–15.5)
WBC: 16.4 K/uL — ABNORMAL HIGH (ref 4.0–10.5)
nRBC: 0 % (ref 0.0–0.2)

## 2024-03-07 LAB — BASIC METABOLIC PANEL WITH GFR
Anion gap: 6 (ref 5–15)
BUN: 14 mg/dL (ref 8–23)
CO2: 25 mmol/L (ref 22–32)
Calcium: 8.3 mg/dL — ABNORMAL LOW (ref 8.9–10.3)
Chloride: 103 mmol/L (ref 98–111)
Creatinine, Ser: 0.78 mg/dL (ref 0.61–1.24)
GFR, Estimated: >60 mL/min (ref >60)
Glucose, Bld: 129 mg/dL — ABNORMAL HIGH (ref 70–99)
Potassium: 4.6 mmol/L (ref 3.5–5.1)
Sodium: 134 mmol/L — ABNORMAL LOW (ref 135–145)

## 2024-03-07 LAB — PHOSPHORUS: Phosphorus: 3.5 mg/dL (ref 2.5–4.6)

## 2024-03-07 LAB — SURGICAL PATHOLOGY

## 2024-03-07 LAB — MAGNESIUM: Magnesium: 2.1 mg/dL (ref 1.7–2.4)

## 2024-03-07 MED ORDER — HYDROMORPHONE HCL 1 MG/ML IJ SOLN
1.0000 mg | INTRAMUSCULAR | Status: DC | PRN
  Administered 2024-03-08: 1 mg via INTRAVENOUS
  Filled 2024-03-07: qty 1

## 2024-03-07 NOTE — Anesthesia Postprocedure Evaluation (Signed)
 Anesthesia Post Note  Patient: James Blake  Procedure(s) Performed: COLECTOMY, PARTIAL  Patient location during evaluation: Phase II Anesthesia Type: General Level of consciousness: awake Pain management: pain level controlled Vital Signs Assessment: post-procedure vital signs reviewed and stable Respiratory status: spontaneous breathing and respiratory function stable Cardiovascular status: blood pressure returned to baseline and stable Postop Assessment: no headache and no apparent nausea or vomiting Anesthetic complications: no Comments: Late entry   No notable events documented.   Last Vitals:  Vitals:   03/06/24 2105 03/07/24 0510  BP: 138/79 131/76  Pulse: 67 (!) 57  Resp: 20 16  Temp: 36.6 C 36.5 C  SpO2: 99% 100%    Last Pain:  Vitals:   03/07/24 0852  TempSrc:   PainSc: 8                  Yvonna JINNY Bosworth

## 2024-03-07 NOTE — Progress Notes (Signed)
 1 Day Post-Op  Subjective: Patient has mild incisional pain.  Has started passing gas.  Objective: Vital signs in last 24 hours: Temp:  [97.7 F (36.5 C)-98.5 F (36.9 C)] 97.7 F (36.5 C) (08/28 0510) Pulse Rate:  [57-74] 57 (08/28 0510) Resp:  [15-20] 16 (08/28 0510) BP: (117-150)/(76-99) 131/76 (08/28 0510) SpO2:  [96 %-100 %] 100 % (08/28 0510) Weight:  [75.2 kg] 75.2 kg (08/27 1651) Last BM Date : 03/05/24  Intake/Output from previous day: 08/27 0701 - 08/28 0700 In: 1514.2 [I.V.:1414.2; IV Piggyback:100] Out: 250 [Urine:200; Blood:50] Intake/Output this shift: No intake/output data recorded.  General appearance: alert, cooperative, and no distress Resp: clear to auscultation bilaterally Cardio: regular rate and rhythm, S1, S2 normal, no murmur, click, rub or gallop GI: Soft, occasional bowel sounds appreciated.  Incision healing well.  Lab Results:  Recent Labs    03/07/24 0425  WBC 16.4*  HGB 12.2*  HCT 37.8*  PLT 211   BMET Recent Labs    03/07/24 0425  NA 134*  K 4.6  CL 103  CO2 25  GLUCOSE 129*  BUN 14  CREATININE 0.78  CALCIUM 8.3*   PT/INR No results for input(s): LABPROT, INR in the last 72 hours.  Studies/Results: No results found.  Anti-infectives: Anti-infectives (From admission, onward)    Start     Dose/Rate Route Frequency Ordered Stop   03/06/24 0845  cefoTEtan  (CEFOTAN ) 2 g in sodium chloride  0.9 % 100 mL IVPB        2 g 200 mL/hr over 30 Minutes Intravenous On call to O.R. 03/06/24 9162 03/06/24 1413       Assessment/Plan: s/p Procedure(s): COLECTOMY, PARTIAL Impression: Stable on postoperative day 1.  Will increase frequency of Dilaudid .  Will advance to full liquid diet.  Patient is ambulating.  Will advance to regular diet once bowel function fully returns.  Final pathology is pending.  LOS: 1 day    James Blake 03/07/2024

## 2024-03-07 NOTE — Plan of Care (Signed)
  Problem: Education: Goal: Knowledge of General Education information will improve Description: Including pain rating scale, medication(s)/side effects and non-pharmacologic comfort measures Outcome: Progressing   Problem: Health Behavior/Discharge Planning: Goal: Ability to manage health-related needs will improve Outcome: Progressing   Problem: Clinical Measurements: Goal: Diagnostic test results will improve Outcome: Progressing   Problem: Activity: Goal: Risk for activity intolerance will decrease Outcome: Progressing   Problem: Nutrition: Goal: Adequate nutrition will be maintained Outcome: Progressing   Problem: Pain Managment: Goal: General experience of comfort will improve and/or be controlled Outcome: Progressing

## 2024-03-08 ENCOUNTER — Telehealth: Payer: Self-pay | Admitting: *Deleted

## 2024-03-08 LAB — BASIC METABOLIC PANEL WITH GFR
Anion gap: 5 (ref 5–15)
BUN: 11 mg/dL (ref 8–23)
CO2: 27 mmol/L (ref 22–32)
Calcium: 8.5 mg/dL — ABNORMAL LOW (ref 8.9–10.3)
Chloride: 104 mmol/L (ref 98–111)
Creatinine, Ser: 0.8 mg/dL (ref 0.61–1.24)
GFR, Estimated: >60 mL/min (ref >60)
Glucose, Bld: 97 mg/dL (ref 70–99)
Potassium: 4.1 mmol/L (ref 3.5–5.1)
Sodium: 136 mmol/L (ref 135–145)

## 2024-03-08 LAB — CBC
HCT: 36.4 % — ABNORMAL LOW (ref 39.0–52.0)
Hemoglobin: 12 g/dL — ABNORMAL LOW (ref 13.0–17.0)
MCH: 29.8 pg (ref 26.0–34.0)
MCHC: 33 g/dL (ref 30.0–36.0)
MCV: 90.3 fL (ref 80.0–100.0)
Platelets: 183 K/uL (ref 150–400)
RBC: 4.03 MIL/uL — ABNORMAL LOW (ref 4.22–5.81)
RDW: 13.5 % (ref 11.5–15.5)
WBC: 9.4 K/uL (ref 4.0–10.5)
nRBC: 0 % (ref 0.0–0.2)

## 2024-03-08 LAB — MAGNESIUM: Magnesium: 1.9 mg/dL (ref 1.7–2.4)

## 2024-03-08 LAB — PHOSPHORUS: Phosphorus: 2.9 mg/dL (ref 2.5–4.6)

## 2024-03-08 MED ORDER — OXYCODONE HCL 5 MG PO TABS: 5.0000 mg | ORAL_TABLET | ORAL | 0 refills | Status: DC | PRN

## 2024-03-08 NOTE — Progress Notes (Signed)
 2 Days Post-Op  Subjective: Patient had several small bowel movements.  Had some mild gas pain.  Objective: Vital signs in last 24 hours: Temp:  [97.9 F (36.6 C)-98.5 F (36.9 C)] 97.9 F (36.6 C) (08/29 0414) Pulse Rate:  [59-67] 64 (08/29 0414) Resp:  [18-19] 19 (08/29 0414) BP: (119-125)/(74-80) 119/76 (08/29 0414) SpO2:  [95 %-100 %] 95 % (08/29 0414) Last BM Date : 03/06/24  Intake/Output from previous day: 08/28 0701 - 08/29 0700 In: 480 [P.O.:480] Out: -  Intake/Output this shift: No intake/output data recorded.  General appearance: alert, cooperative, and no distress Resp: clear to auscultation bilaterally Cardio: regular rate and rhythm, S1, S2 normal, no murmur, click, rub or gallop GI: Soft, positive bowel sounds.  Incision healing well.  Lab Results:  Recent Labs    03/07/24 0425 03/08/24 0408  WBC 16.4* 9.4  HGB 12.2* 12.0*  HCT 37.8* 36.4*  PLT 211 183   BMET Recent Labs    03/07/24 0425 03/08/24 0408  NA 134* 136  K 4.6 4.1  CL 103 104  CO2 25 27  GLUCOSE 129* 97  BUN 14 11  CREATININE 0.78 0.80  CALCIUM 8.3* 8.5*   PT/INR No results for input(s): LABPROT, INR in the last 72 hours.  Studies/Results: No results found.  Anti-infectives: Anti-infectives (From admission, onward)    Start     Dose/Rate Route Frequency Ordered Stop   03/06/24 0845  cefoTEtan  (CEFOTAN ) 2 g in sodium chloride  0.9 % 100 mL IVPB        2 g 200 mL/hr over 30 Minutes Intravenous On call to O.R. 03/06/24 9162 03/06/24 1413       Assessment/Plan: s/p Procedure(s): COLECTOMY, PARTIAL Impression: Doing well on postoperative day 2.  Bowel function returning.  Will advance to heart healthy diet.  Anticipate discharge in next 24 to 48 hours.  Final pathology revealed diverticulitis.  LOS: 2 days    [name] 03/08/2024

## 2024-03-08 NOTE — Telephone Encounter (Signed)
 Received fax requesting prior authorization for Oxycodone  5 mg tablets.  PA submitted via CoverMyMeds.  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

## 2024-03-08 NOTE — Telephone Encounter (Signed)
 [employer] has not yet replied to your PA request. Depending on the information you've provided, additional questions may be returned by the plan. You may close this dialog, return to your dashboard, and perform other tasks.  To check for an update later, open this request again from your dashboard.  If [employer] has not replied to your request within 24 hours please contact [employer] at [phone].

## 2024-03-08 NOTE — Plan of Care (Signed)
   Problem: Education: Goal: Knowledge of General Education information will improve Description Including pain rating scale, medication(s)/side effects and non-pharmacologic comfort measures Outcome: Progressing   Problem: Health Behavior/Discharge Planning: Goal: Ability to manage health-related needs will improve Outcome: Progressing

## 2024-03-08 NOTE — Telephone Encounter (Signed)
 Received authorization determination.   PA approved 03/08/2024- 04/08/2024.

## 2024-03-09 NOTE — Discharge Summary (Signed)
 Physician Discharge Summary  Patient ID: James Blake MRN: [id number] DOB/AGE: [birth year] 63 y.o.  Admit date: 03/06/2024 Discharge date: 03/09/2024  Admission Diagnoses: Status post partial colectomy, diverticulitis of colon  Discharge Diagnoses:  Principal Problem:   S/P partial colectomy Active Problems:   Diverticulitis of colon  Discharged Condition: stable  Hospital Course: Patient is a 63 year old male who was admitted status post partial colectomy for recurrent episodes of sigmoid diverticulitis complicated with abscess formation.  He underwent colectomy on 8/26.  Postoperatively, he was tolerating a diet without nausea and vomiting, had return of bowel function and is having multiple bowel movements, and pain is well-controlled with oral medications.  He is stable for discharge home at this time.  He will follow-up with Dr. Mavis for staple removal.  He has been given a prescription for oxycodone  as needed for pain.  Consults: None  Significant Diagnostic Studies: None  Treatments: IV hydration, antibiotics: Cefotan , analgesia: acetaminophen , Dilaudid , and oxycodone , and surgery: Partial colectomy  Discharge Exam: Blood pressure 133/85, pulse 64, temperature 98.4 F (36.9 C), temperature source Oral, resp. rate 20, height 5' 11 (1.803 m), weight 75.2 kg, SpO2 96%. General appearance: alert, cooperative, and no distress GI: Soft, nondistended no percussion tenderness, minimal incisional tenderness to palpation; no rigidity, guarding, rebound tenderness; midline incision C/D/I with skin staples in place  Disposition: Discharge disposition: 01-Home or Self Care       Discharge Instructions     Call MD for:  persistant nausea and vomiting   Complete by: As directed    Call MD for:  redness, tenderness, or signs of infection (pain, swelling, redness, odor or green/yellow discharge around incision site)   Complete by: As directed    Call MD for:  severe  uncontrolled pain   Complete by: As directed    Call MD for:  temperature >100.4   Complete by: As directed    Diet - low sodium heart healthy   Complete by: As directed    Increase activity slowly   Complete by: As directed       Allergies as of 03/09/2024   No Known Allergies      Medication List     TAKE these medications    acetaminophen  325 MG tablet Commonly known as: TYLENOL  Take 650 mg by mouth every 6 (six) hours as needed for moderate pain.   cyclobenzaprine 10 MG tablet Commonly known as: FLEXERIL Take 10 mg by mouth 3 (three) times daily as needed for muscle spasms.   ibuprofen  200 MG tablet Commonly known as: ADVIL  Take 2 tablets (400 mg total) by mouth every 8 (eight) hours as needed for moderate pain.   metroNIDAZOLE  500 MG tablet Commonly known as: FLAGYL  Take 1 tablet (500 mg total) by mouth 3 (three) times daily. Take one tablet by mouth at 1pm, 2pm, and 9pm the day before surgery   neomycin  500 MG tablet Commonly known as: MYCIFRADIN  Take 2 tablets (1,000 mg total) by mouth 3 (three) times daily. Take 2 tablets by mouth at 1pm, 2pm, and 9pm the day before surgery   oxyCODONE  5 MG immediate release tablet Commonly known as: Roxicodone  Take 1 tablet (5 mg total) by mouth every 4 (four) hours as needed.   Sutab  [phone] MG Tabs Generic drug: Sodium Sulfate-Mag Sulfate-KCl Take by mouth.        Follow-up Information     Mavis Anes, MD. Schedule an appointment as soon as possible for a visit on 03/12/2024.   Specialty:  General Surgery Contact information: [id number] [place] [address] [phone]                 Signed: Kadeja Granada A Billie Trager 03/09/2024, 12:50 PM

## 2024-03-09 NOTE — Discharge Instructions (Signed)
 Surgery Discharge Instructions  Activity  You are advised to go directly home from the hospital.  Restrict your activities and rest for a day.  Resume light activity tomorrow. No heavy lifting over 10 lbs or strenuous exercise.  Fluids and Diet GI soft diet  Medications  If you have not had a bowel movement in 24 hours, take 2 tablespoons over the counter Milk of mag.             You May resume your blood thinners tomorrow (Aspirin, coumadin, or other).  You are being discharged with prescriptions for Opioid/Narcotic Medications: There are some specific considerations for these medications that you should know. Opioid Meds have risks & benefits. Addiction to these meds is always a concern with prolonged use Take medication only as directed Do not drive while taking narcotic pain medication Do not crush tablets or capsules Do not use a different container than medication was dispensed in Lock the container of medication in a cool, dry place out of reach of children and pets. Opioid medication can cause addiction Do not share with anyone else (this is a felony) Do not store medications for future use. Dispose of them properly.     Disposal:  Find a [facility]  household drug take back site near you.  If you can't get to a drug take back site, use the recipe below as a last resort to dispose of expired, unused or unwanted drugs. Disposal  (Do not dispose chemotherapy drugs this way, talk to your prescribing doctor instead.) Step 1: Mix drugs (do not crush) with dirt, kitty litter, or used coffee grounds and add a small amount of water to dissolve any solid medications. Step 2: Seal drugs in plastic bag. Step 3: Place plastic bag in trash. Step 4: Take prescription container and scratch out personal information, then recycle or throw away.  Operative Site  You have skin staples in place.  These will be removed at your follow-up visit. 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 Keep wound clean and dry. No  baths or swimming. No lifting more than 10 pounds.  Contact Information: If you have questions or concerns, please call our office, [phone], Monday- Thursday 8AM-5PM and Friday 8AM-12Noon.  If it is after hours or on the weekend, please call [facility] Main Number, [phone], and ask to speak to the surgeon on call for Dr. Mavis at [facility].   SPECIFIC COMPLICATIONS TO WATCH FOR: Inability to urinate Fever over 101? F by mouth Nausea and vomiting lasting longer than 24 hours. Pain not relieved by medication ordered Swelling around the operative site Increased redness, warmth, hardness, around operative area Numbness, tingling, or cold fingers or toes Blood -soaked dressing, (small amounts of oozing may be normal) Increasing and progressive drainage from surgical area or exam site

## 2024-03-09 NOTE — Plan of Care (Signed)

## 2024-03-11 LAB — AEROBIC/ANAEROBIC CULTURE W GRAM STAIN (SURGICAL/DEEP WOUND): Gram Stain: NONE SEEN

## 2024-03-12 ENCOUNTER — Encounter: Payer: Self-pay | Admitting: General Surgery

## 2024-03-12 ENCOUNTER — Ambulatory Visit (INDEPENDENT_AMBULATORY_CARE_PROVIDER_SITE_OTHER): Admitting: General Surgery

## 2024-03-12 VITALS — BP 123/83 | HR 75 | Temp 98.0°F | Resp 14 | Ht 71.0 in | Wt 153.0 lb

## 2024-03-12 DIAGNOSIS — Z09 Encounter for follow-up examination after completed treatment for conditions other than malignant neoplasm: Secondary | ICD-10-CM

## 2024-03-12 MED ORDER — OXYCODONE HCL 5 MG PO TABS: 5.0000 mg | ORAL_TABLET | ORAL | 0 refills | Status: AC | PRN

## 2024-03-12 NOTE — Progress Notes (Signed)
 Subjective:     James Blake  Patient here for postoperative visit, status post sigmoid colectomy.  He is doing very well.  His bowel movements have normalized.  He denies any fever or chills.  He is having mild incisional pain and some gas pain. Objective:    BP 123/83   Pulse 75   Temp 98 F (36.7 C) (Oral)   Resp 14   Ht 5' 11 (1.803 m)   Wt 153 lb (69.4 kg)   SpO2 97%   BMI 21.34 kg/m   General:  alert, cooperative, and no distress  Abdomen soft, incision healing well.  Staples removed, Steri-Strips applied. Final pathology consistent with diagnosis.  No malignancy seen.     Assessment:    Doing well postoperatively.    Plan:   Patient may increase his activity as able.  I did reorder his Roxicodone  for pain.  Will need a follow-up colonoscopy in 3 to 6 months as the initial one was incomplete.  He will contact our office to schedule that.

## 2024-04-24 ENCOUNTER — Telehealth: Payer: Self-pay | Admitting: *Deleted

## 2024-04-24 NOTE — Telephone Encounter (Signed)
 Received call from patient [phone]~ telephone.   Patient reports x2 days of lower abdominal pain, difficulty starting urine stream, and difficulty emptying bladder. States that Sx are similar to the Sx of diverticular abscess.  Advised to go to ER for evaluation.   Patient states that PCP gave Rx for Flagyl  for him to keep in case Sx returned. States that he has called to have ABTx filled.   Again advised to go to ER as generalized lower abdominal pain and urinary difficulty is vague and may not be diverticulitis/ abscess.   Patient states that he is going to call PCP and then decide.
